# Patient Record
Sex: Female | Born: 1950 | Race: White | Hispanic: No | Marital: Married
Health system: Midwestern US, Community
[De-identification: ages and names within clinical notes are randomized; demographics above are authoritative.]

## PROBLEM LIST (undated history)

## (undated) DIAGNOSIS — G4700 Insomnia, unspecified: Secondary | ICD-10-CM

## (undated) DIAGNOSIS — B000 Eczema herpeticum: Secondary | ICD-10-CM

## (undated) DIAGNOSIS — E785 Hyperlipidemia, unspecified: Secondary | ICD-10-CM

## (undated) DIAGNOSIS — F1010 Alcohol abuse, uncomplicated: Secondary | ICD-10-CM

## (undated) DIAGNOSIS — F411 Generalized anxiety disorder: Secondary | ICD-10-CM

## (undated) DIAGNOSIS — E039 Hypothyroidism, unspecified: Secondary | ICD-10-CM

## (undated) HISTORY — DX: Alcohol abuse, uncomplicated: F10.10

## (undated) HISTORY — PX: ABDOMINAL HYSTERECTOMY: SHX81

## (undated) HISTORY — DX: Hyperlipidemia, unspecified: E78.5

## (undated) HISTORY — DX: Eczema herpeticum: B00.0

## (undated) HISTORY — DX: Hypothyroidism, unspecified: E03.9

## (undated) HISTORY — DX: Insomnia, unspecified: G47.00

## (undated) HISTORY — DX: Generalized anxiety disorder: F41.1

---

## 1976-05-19 HISTORY — PX: APPENDECTOMY: SHX54

## 2007-12-13 ENCOUNTER — Emergency Department: Payer: Self-pay | Admitting: Emergency Medicine

## 2007-12-23 ENCOUNTER — Ambulatory Visit: Payer: Self-pay | Admitting: Unknown Physician Specialty

## 2007-12-29 ENCOUNTER — Ambulatory Visit: Payer: Self-pay | Admitting: Internal Medicine

## 2008-01-18 ENCOUNTER — Ambulatory Visit: Payer: Self-pay | Admitting: Unknown Physician Specialty

## 2009-02-08 ENCOUNTER — Ambulatory Visit: Payer: Self-pay | Admitting: Internal Medicine

## 2010-03-12 ENCOUNTER — Ambulatory Visit: Payer: Self-pay | Admitting: Internal Medicine

## 2010-12-09 ENCOUNTER — Emergency Department: Payer: Self-pay | Admitting: *Deleted

## 2011-03-06 ENCOUNTER — Other Ambulatory Visit: Payer: Self-pay | Admitting: Internal Medicine

## 2011-03-07 ENCOUNTER — Other Ambulatory Visit: Payer: Self-pay | Admitting: Internal Medicine

## 2011-03-11 ENCOUNTER — Other Ambulatory Visit: Payer: Self-pay | Admitting: *Deleted

## 2011-03-11 MED ORDER — CHLORDIAZEPOXIDE HCL 25 MG PO CAPS: 25.0000 mg | ORAL_CAPSULE | Freq: Every day | ORAL | Status: AC

## 2011-03-11 NOTE — Telephone Encounter (Signed)
Opened in error

## 2011-03-12 ENCOUNTER — Other Ambulatory Visit: Payer: Self-pay | Admitting: Internal Medicine

## 2011-03-12 DIAGNOSIS — E039 Hypothyroidism, unspecified: Secondary | ICD-10-CM

## 2011-03-14 MED ORDER — LEVOTHYROXINE SODIUM 50 MCG PO TABS: 50.0000 ug | ORAL_TABLET | Freq: Every day | ORAL | Status: DC

## 2011-03-14 NOTE — Telephone Encounter (Signed)
Refill request for synthroid approved.

## 2011-03-19 ENCOUNTER — Ambulatory Visit: Payer: Self-pay | Admitting: Internal Medicine

## 2011-04-08 ENCOUNTER — Encounter: Payer: Self-pay | Admitting: Internal Medicine

## 2011-04-11 ENCOUNTER — Other Ambulatory Visit: Payer: Self-pay | Admitting: *Deleted

## 2011-04-11 MED ORDER — TRAZODONE HCL 100 MG PO TABS: 100.0000 mg | ORAL_TABLET | Freq: Every day | ORAL | Status: DC

## 2011-04-24 ENCOUNTER — Other Ambulatory Visit: Payer: Self-pay | Admitting: Internal Medicine

## 2011-04-24 NOTE — Telephone Encounter (Signed)
[phone] Pt called to get rx  For libruim  [facility] Pt had no idea what the dosage was.

## 2011-04-25 MED ORDER — CHLORDIAZEPOXIDE HCL 25 MG PO CAPS: 25.0000 mg | ORAL_CAPSULE | Freq: Every day | ORAL | Status: AC

## 2011-04-25 NOTE — Telephone Encounter (Signed)
Please find out when her last refill of chlordiazepoxide was.  Ok to refill if it has been 30 days

## 2011-06-11 ENCOUNTER — Other Ambulatory Visit: Payer: Self-pay | Admitting: *Deleted

## 2011-06-11 NOTE — Telephone Encounter (Signed)
Faxed request from [facility], no last filled date given.

## 2011-06-12 ENCOUNTER — Telehealth: Payer: Self-pay | Admitting: Internal Medicine

## 2011-06-12 NOTE — Telephone Encounter (Signed)
[phone] Pt called checking on her rx for librim  [facility] [address] [facility]  Yesterday Please advise pt if you have this

## 2011-06-12 NOTE — Telephone Encounter (Signed)
Refill request has been sent to Dr. Tullo.  

## 2011-06-12 NOTE — Telephone Encounter (Signed)
Pt is asking for refill, she is almost out.  She came by the office and signed a release of information form for [facility].

## 2011-06-13 NOTE — Telephone Encounter (Signed)
Pharmacy says last refill was yesterday, 06/12/11

## 2011-06-13 NOTE — Telephone Encounter (Signed)
Please ask pharmacy when last refill was

## 2011-06-17 ENCOUNTER — Encounter: Payer: Self-pay | Admitting: Internal Medicine

## 2011-07-09 ENCOUNTER — Other Ambulatory Visit: Payer: Self-pay | Admitting: *Deleted

## 2011-07-09 MED ORDER — CHLORDIAZEPOXIDE HCL 25 MG PO CAPS: 25.0000 mg | ORAL_CAPSULE | Freq: Every day | ORAL | Status: DC

## 2011-07-11 ENCOUNTER — Telehealth: Payer: Self-pay | Admitting: *Deleted

## 2011-07-11 NOTE — Telephone Encounter (Signed)
If this is a new request , it is denied,  She was given refill yesterday for #10 pills

## 2011-07-11 NOTE — Telephone Encounter (Signed)
Request for Chlordiazepoxide 25 mg

## 2011-07-14 ENCOUNTER — Other Ambulatory Visit: Payer: Self-pay | Admitting: Internal Medicine

## 2011-07-14 MED ORDER — TRAZODONE HCL 100 MG PO TABS: 100.0000 mg | ORAL_TABLET | Freq: Every day | ORAL | Status: DC

## 2011-07-16 ENCOUNTER — Ambulatory Visit: Payer: BC Managed Care – PPO | Admitting: *Deleted

## 2011-07-16 ENCOUNTER — Ambulatory Visit (INDEPENDENT_AMBULATORY_CARE_PROVIDER_SITE_OTHER)
Admission: RE | Admit: 2011-07-16 | Discharge: 2011-07-16 | Disposition: A | Discharge status: Home / Self Care | Payer: BC Managed Care – PPO | Source: Ambulatory Visit | Attending: Internal Medicine | Admitting: Internal Medicine

## 2011-07-16 ENCOUNTER — Other Ambulatory Visit: Payer: Self-pay | Admitting: Internal Medicine

## 2011-07-16 DIAGNOSIS — R071 Chest pain on breathing: Secondary | ICD-10-CM

## 2011-07-16 DIAGNOSIS — R0789 Other chest pain: Secondary | ICD-10-CM

## 2011-09-02 ENCOUNTER — Other Ambulatory Visit: Payer: Self-pay | Admitting: Internal Medicine

## 2011-09-02 MED ORDER — CHLORDIAZEPOXIDE HCL 25 MG PO CAPS: 25.0000 mg | ORAL_CAPSULE | Freq: Every day | ORAL | Status: DC

## 2011-09-02 NOTE — Telephone Encounter (Signed)
Patient called back again and wanted to know if you were going to refill the medication, I advised her that you are still seeing patient and when you had the chance to answer the message I would call her back.  She stated she is "about to crawl out of her skin".  I advised again that I would call her back.

## 2011-09-02 NOTE — Telephone Encounter (Signed)
Rx called in . Patient notified

## 2011-09-02 NOTE — Telephone Encounter (Signed)
Patient called and wanted to know if you could refill her Librium.  She stated she is under a great deal of stress right now, her mother has dementia, she is flying tonight to go to [place] to help her sister sell the estate, and her brother just had surgery on his brain.  Please advise.

## 2011-09-02 NOTE — Telephone Encounter (Signed)
Ok to refill librium #10 tablets.  Please tell patient that unless she makes an appt she will receive no more refills.  My office has been open 8 months now and I have not seen her.

## 2011-10-08 ENCOUNTER — Ambulatory Visit: Payer: BC Managed Care – PPO | Admitting: Internal Medicine

## 2011-10-16 ENCOUNTER — Other Ambulatory Visit: Payer: Self-pay | Admitting: Internal Medicine

## 2011-10-16 MED ORDER — CHLORDIAZEPOXIDE HCL 25 MG PO CAPS: 25.0000 mg | ORAL_CAPSULE | Freq: Every day | ORAL | Status: DC

## 2011-10-16 NOTE — Telephone Encounter (Signed)
Left detailed message notifying patient, Rx has been called in.

## 2011-10-16 NOTE — Telephone Encounter (Signed)
I will authorize a refill for #10 librium

## 2011-10-16 NOTE — Telephone Encounter (Signed)
Pt called stated her mom passed away yesterday she is leaving for [place] tonight  She will be leaving about 5 today to catch her flight  She stated that in the past dr Darrick Huntsman gave her libreium  Pt stated she didn't need that many just enough to get through this week She wanted to know if she could get a rx for this. [facility]

## 2011-10-26 ENCOUNTER — Other Ambulatory Visit: Payer: Self-pay | Admitting: Internal Medicine

## 2011-12-08 ENCOUNTER — Encounter: Payer: Self-pay | Admitting: *Deleted

## 2011-12-12 ENCOUNTER — Encounter: Payer: Self-pay | Admitting: Internal Medicine

## 2011-12-22 ENCOUNTER — Telehealth: Payer: Self-pay | Admitting: Internal Medicine

## 2011-12-22 NOTE — Telephone Encounter (Signed)
Pt walked in today with sever anxity did not want to leave message wants to talk to nurse this morning.  Offered pt 4 today.  Wanted to talk to nurse

## 2011-12-22 NOTE — Telephone Encounter (Signed)
PATIENT SCHEDULED FOR Friday.

## 2011-12-26 ENCOUNTER — Ambulatory Visit: Payer: BC Managed Care – PPO | Admitting: Internal Medicine

## 2012-01-20 ENCOUNTER — Other Ambulatory Visit: Payer: Self-pay | Admitting: *Deleted

## 2012-01-21 ENCOUNTER — Other Ambulatory Visit: Payer: Self-pay | Admitting: *Deleted

## 2012-01-23 MED ORDER — TRAZODONE HCL 100 MG PO TABS: 100.0000 mg | ORAL_TABLET | Freq: Every day | ORAL | Status: DC

## 2012-01-26 ENCOUNTER — Ambulatory Visit: Payer: BC Managed Care – PPO | Admitting: Internal Medicine

## 2012-01-26 ENCOUNTER — Telehealth: Payer: Self-pay | Admitting: Internal Medicine

## 2012-01-26 NOTE — Telephone Encounter (Signed)
Ms worrall has not been seen here and we have been open more over one year,  Please inform her that there will be no more refills on any of her medications untile she has been seen,

## 2012-01-27 NOTE — Telephone Encounter (Signed)
Patient was notified, she was transferred up front to schedule appt

## 2012-02-10 ENCOUNTER — Telehealth: Payer: Self-pay | Admitting: Internal Medicine

## 2012-02-10 NOTE — Telephone Encounter (Signed)
Yes we can waive the fee

## 2012-02-10 NOTE — Telephone Encounter (Signed)
Yes, we can.

## 2012-02-10 NOTE — Telephone Encounter (Signed)
Patient called in states that she received a No Show fee for 01/26/12. She states that it was a Monday appointment and she tried calling over the weekend to cancel however was unsuccessful.  Can we please waive her no show fee? Thanks [employer]

## 2012-02-11 ENCOUNTER — Encounter: Payer: Self-pay | Admitting: Internal Medicine

## 2012-02-11 ENCOUNTER — Ambulatory Visit (INDEPENDENT_AMBULATORY_CARE_PROVIDER_SITE_OTHER): Payer: BC Managed Care – PPO | Admitting: Internal Medicine

## 2012-02-11 VITALS — BP 162/88 | HR 72 | Temp 98.1°F | Resp 18 | Wt 154.5 lb

## 2012-02-11 DIAGNOSIS — R5383 Other fatigue: Secondary | ICD-10-CM

## 2012-02-11 DIAGNOSIS — F419 Anxiety disorder, unspecified: Secondary | ICD-10-CM

## 2012-02-11 DIAGNOSIS — F1010 Alcohol abuse, uncomplicated: Secondary | ICD-10-CM | POA: Insufficient documentation

## 2012-02-11 DIAGNOSIS — E039 Hypothyroidism, unspecified: Secondary | ICD-10-CM | POA: Insufficient documentation

## 2012-02-11 DIAGNOSIS — F411 Generalized anxiety disorder: Secondary | ICD-10-CM

## 2012-02-11 DIAGNOSIS — Z79899 Other long term (current) drug therapy: Secondary | ICD-10-CM

## 2012-02-11 DIAGNOSIS — E038 Other specified hypothyroidism: Secondary | ICD-10-CM

## 2012-02-11 DIAGNOSIS — R5381 Other malaise: Secondary | ICD-10-CM

## 2012-02-11 DIAGNOSIS — F1011 Alcohol abuse, in remission: Secondary | ICD-10-CM

## 2012-02-11 MED ORDER — PAROXETINE HCL ER 12.5 MG PO TB24: 12.5000 mg | ORAL_TABLET | ORAL | Status: DC

## 2012-02-11 MED ORDER — ALPRAZOLAM ER 1 MG PO TB24: 1.0000 mg | ORAL_TABLET | ORAL | Status: DC

## 2012-02-11 NOTE — Assessment & Plan Note (Signed)
I confronted her about her frequent use of Librium and as addressed possibility that she was using Librium and as a replacement for the alcohol. She understands the daily use of Librium amounts to dependence.

## 2012-02-11 NOTE — Progress Notes (Signed)
Patient ID: Anne Hickman, female   DOB: 12/08/1950, 61 y.o.   MRN: [id number] Patient Active Problem List  Diagnosis  . Alcohol abuse, in remission  . GAD (generalized anxiety disorder)  . Unspecified hypothyroidism    Subjective:  CC:   Chief Complaint  Patient presents with  . Follow-up  . Anxiety    HPI:   Ruthmary Occhipinti is a 61 y.o. female who presents as a new patient to establish primary care with the chief complaint of anxiety.  She is a 61 year old white female with a history of alcohol abuse and generalized anxiety who has not been seen in over year and is here to resstablixh care  No records are available from prior practice. She has been requesting refills of Librium which she has used in the past to help her detox from alcohol abuse. Over the past several months she has used the same excuse repeatedly to get her prescription refilled without being seen. In may she reported that her mother had died and that  she was flying to [place] that night and had run outo f librium. Today she states that this was a mistake, her mother is alive and living in an A/L home in [facility] for patients with dementia. She has been without Librium in several for several weeks apparently. She states that she is in maintaining her sobriety despite being out of Librium. She has persistent anxiety without true panic attacks. Her triggers are the continuing decline of her mother who has dementia and has been recently relocated to another facility. She gets very emotionally stressed when she is conversations with her sister who is the executor of her mother's estate. She has tried exercising to help relieve her tension. She has no prior trial of SSRIs or other medications to manage her anxiety. She uses trazodone to help her sleep at night.     Past Medical History  Diagnosis Date  . Insomnia   . Generalized anxiety disorder   . Other and unspecified hyperlipidemia   . Unspecified hypothyroidism   . Alcohol  abuse, episodic   . Eczema herpeticum     Past Surgical History  Procedure Date  . Appendectomy 1978    Family History  Problem Relation Age of Onset  . Coronary artery disease Father   . Heart disease Father   . Breast cancer Maternal Aunt   . Mental illness Mother     History   Social History  . Marital Status: Married    Spouse Name: N/A    Number of Children: 4  . Years of Education: N/A   Occupational History  . Full time    Social History Main Topics  . Smoking status: Never Smoker   . Smokeless tobacco: Never Used  . Alcohol Use: No     1- 2 bottles of wine a day  . Drug Use: No  . Sexually Active: Not on file   Other Topics Concern  . Not on file   Social History Narrative   Lives with spouse.         @ALLHX @    Review of Systems:   The remainder of the review of systems was negative except those addressed in the HPI.       Objective:  BP 162/88  Pulse 72  Temp 98.1 F (36.7 C) (Oral)  Resp 18  Wt 154 lb 8 oz (70.081 kg)  SpO2 97%  General appearance: alert, cooperative and appears stated age Ears: normal TM's and  external ear canals both ears Throat: lips, mucosa, and tongue normal; teeth and gums normal Neck: no adenopathy, no carotid bruit, supple, symmetrical, trachea midline and thyroid not enlarged, symmetric, no tenderness/mass/nodules Back: symmetric, no curvature. ROM normal. No CVA tenderness. Lungs: clear to auscultation bilaterally Heart: regular rate and rhythm, S1, S2 normal, no murmur, click, rub or gallop Abdomen: soft, non-tender; bowel sounds normal; no masses,  no organomegaly Pulses: 2+ and symmetric Skin: Skin color, texture, turgor normal. No rashes or lesions Lymph nodes: Cervical, supraclavicular, and axillary nodes normal.  Assessment and Plan:  Alcohol abuse, in remission I confronted her about her frequent use of Librium and as addressed possibility that she was using Librium and as a replacement  for the alcohol. She understands the daily use of Librium amounts to dependence.  GAD (generalized anxiety disorder) Trial of Paxil CR recommended and accepted. Initially we will also use alprazolam ER to help manage the anxiety initially. She will return in one month. She is also urged to resume her therapeutic relationship with Dr. Sebastian Ache     Updated Medication List Outpatient Encounter Prescriptions as of 02/11/2012  Medication Sig Dispense Refill  . levothyroxine (SYNTHROID, LEVOTHROID) 50 MCG tablet TAKE 1 TABLET BY MOUTH DAILY.  30 tablet  3  . traZODone (DESYREL) 100 MG tablet Take 1 tablet (100 mg total) by mouth at bedtime.  30 tablet  3  . ALPRAZolam (XANAX XR) 1 MG 24 hr tablet Take 1 tablet (1 mg total) by mouth every morning.  30 tablet  0  . PARoxetine (PAXIL-CR) 12.5 MG 24 hr tablet Take 1 tablet (12.5 mg total) by mouth every morning.  30 tablet  1  . DISCONTD: chlordiazePOXIDE (LIBRIUM) 25 MG capsule Take 1 capsule (25 mg total) by mouth daily. Take one capsule daily as needed for extreme anxiety  10 capsule  0  . DISCONTD: desoximetasone (TOPICORT) 0.25 % cream Apply cream as needed for eczema flare      . DISCONTD: fenofibrate (TRICOR) 145 MG tablet Take 145 mg by mouth daily.      Marland Kitchen DISCONTD: sertraline (ZOLOFT) 50 MG tablet Take one and a half tablets by mouth daily      . DISCONTD: traZODone (DESYREL) 100 MG tablet Take 1 tablet (100 mg total) by mouth at bedtime.  30 tablet  2     Orders Placed This Encounter  Procedures  . Lipid panel  . TSH  . Comprehensive metabolic panel    No Follow-up on file.

## 2012-02-11 NOTE — Patient Instructions (Addendum)
I am prescribing Paxil  At a low daily dose for management of generalized anxiety. . It may take up to 2 weeks to see the full effect   You may also take the alprazolam once daily  To manage the anxiety right away  Come back in 3 to 4 weeks  See Dr. Sebastian Ache as soon as possible to start with your counseling.  Continue Trazodone as needed for insomnia  I do recommend that you continue exercising daily to help manage your anxiety    Return to the office  At your earliest convenience to check your liver,kideny, thyroid and cholesterol.

## 2012-02-11 NOTE — Assessment & Plan Note (Signed)
Trial of Paxil CR recommended and accepted. Initially we will also use alprazolam ER to help manage the anxiety initially. She will return in one month. She is also urged to resume her therapeutic relationship with Dr. Sebastian Ache

## 2012-04-09 ENCOUNTER — Telehealth: Payer: Self-pay | Admitting: Internal Medicine

## 2012-04-09 NOTE — Telephone Encounter (Signed)
She was supposed to return for one month follow up and did not. The alprazolam is addictive just like the librium and I will refill one month only ,  If she does not keep an appt there will be no more refills.  She should continue  the paxil unless it is making her feel bad.

## 2012-04-09 NOTE — Telephone Encounter (Signed)
Caller: Anne Hickman; PCP: Duncan Dull (Adults only); CB#: [phone]; Call regarding Refill for xanax; started xanax and paxil in September 2013; wants Dr. Darrick Huntsman to know she would like to use just the xanax but not the paxil.  Denies emergent symptoms; declines new triage.  [phone]  May reach patient [phone].

## 2012-04-12 ENCOUNTER — Other Ambulatory Visit: Payer: Self-pay

## 2012-04-12 MED ORDER — ALPRAZOLAM ER 1 MG PO TB24: 1.0000 mg | ORAL_TABLET | ORAL | Status: DC

## 2012-04-12 NOTE — Telephone Encounter (Signed)
Alprazolam 1 mg 24 hr # 30 0 [facility] Patient advised that appt is needed for further refills.

## 2012-04-12 NOTE — Telephone Encounter (Signed)
Patient notified via phone, she stated that she had rescheduled her appt due to her not having her blood drawn yet.

## 2012-04-14 ENCOUNTER — Other Ambulatory Visit: Payer: BC Managed Care – PPO

## 2012-05-14 ENCOUNTER — Other Ambulatory Visit: Payer: Self-pay | Admitting: *Deleted

## 2012-05-14 ENCOUNTER — Telehealth: Payer: Self-pay | Admitting: *Deleted

## 2012-05-14 DIAGNOSIS — E785 Hyperlipidemia, unspecified: Secondary | ICD-10-CM

## 2012-05-14 DIAGNOSIS — R5381 Other malaise: Secondary | ICD-10-CM

## 2012-05-14 DIAGNOSIS — E569 Vitamin deficiency, unspecified: Secondary | ICD-10-CM

## 2012-05-14 NOTE — Telephone Encounter (Signed)
Fasting lipids, TSH,  CBC w diff, Vit D 25 OHD   and CMET  272.4, 780.79,  269.2 thanks

## 2012-05-14 NOTE — Telephone Encounter (Signed)
Pt is coming in for labs 12.30.2013 what labs and dx would you like? Thank you

## 2012-05-17 ENCOUNTER — Other Ambulatory Visit: Payer: BC Managed Care – PPO

## 2012-05-27 ENCOUNTER — Ambulatory Visit: Payer: Self-pay | Admitting: Internal Medicine

## 2012-06-04 ENCOUNTER — Ambulatory Visit (INDEPENDENT_AMBULATORY_CARE_PROVIDER_SITE_OTHER): Payer: BC Managed Care – PPO | Admitting: Internal Medicine

## 2012-06-04 ENCOUNTER — Other Ambulatory Visit: Payer: BC Managed Care – PPO

## 2012-06-04 ENCOUNTER — Encounter: Payer: Self-pay | Admitting: Internal Medicine

## 2012-06-04 VITALS — BP 150/78 | HR 74 | Temp 97.2°F | Resp 16 | Wt 156.2 lb

## 2012-06-04 DIAGNOSIS — F1011 Alcohol abuse, in remission: Secondary | ICD-10-CM

## 2012-06-04 DIAGNOSIS — F411 Generalized anxiety disorder: Secondary | ICD-10-CM

## 2012-06-04 DIAGNOSIS — K521 Toxic gastroenteritis and colitis: Secondary | ICD-10-CM

## 2012-06-04 DIAGNOSIS — R197 Diarrhea, unspecified: Secondary | ICD-10-CM

## 2012-06-04 DIAGNOSIS — J0100 Acute maxillary sinusitis, unspecified: Secondary | ICD-10-CM

## 2012-06-04 DIAGNOSIS — T3695XA Adverse effect of unspecified systemic antibiotic, initial encounter: Secondary | ICD-10-CM

## 2012-06-04 MED ORDER — METRONIDAZOLE 500 MG PO TABS: 500.0000 mg | ORAL_TABLET | Freq: Three times a day (TID) | ORAL | Status: DC

## 2012-06-04 MED ORDER — ALPRAZOLAM ER 1 MG PO TB24: 1.0000 mg | ORAL_TABLET | ORAL | Status: DC

## 2012-06-04 MED ORDER — BENZONATATE 200 MG PO CAPS: 200.0000 mg | ORAL_CAPSULE | Freq: Three times a day (TID) | ORAL | Status: DC | PRN

## 2012-06-04 NOTE — Patient Instructions (Addendum)
I am concerned that your diarrhea is contagious and needs to be treated with a different antibiotic called flagyl  Take it 3 times daily with food if possible and do not drink andy alcohol .   Stop the augmentin.  Do not use immodium  Clear liquid diet to let your bowels rest  Used sudafed PE 10 to 30 mg every 6 to 8 hours to decongest your sinuses  Flush sinuses twice daily with Simply saline to remove bacteria and hydrate passages.   Benzonatate capsules every 8 hours for cough.    Try to return the stool sample to confirm the presence

## 2012-06-04 NOTE — Progress Notes (Signed)
Patient ID: Anne Hickman, female   DOB: 03-02-1951, 62 y.o.   MRN: [id number]   Patient Active Problem List  Diagnosis  . Alcohol abuse, in remission  . GAD (generalized anxiety disorder)  . Unspecified hypothyroidism  . Antibiotic-associated diarrhea  . Sinusitis, acute maxillary    Subjective:  CC:   Chief Complaint  Patient presents with  . Congestion    HPI:   Anne Hickman a 62 y.o. female who presents with multiple issues. 1) URI Symptoms started before christmas  Took a z pack.  Still had a dry cough , frontal sinus pain no ear pain.  Now on augmentin since saturdday  2) DIARRHEA>  Started 3 days ago with   Anorexia, watery stools and fecal incontinence 3) Anxiety disorder:  Not tolerating paxil so stopped it.  Felt too drugged.  Using the alprazolam prn,  Not daily     Past Medical History  Diagnosis Date  . Insomnia   . Generalized anxiety disorder   . Other and unspecified hyperlipidemia   . Unspecified hypothyroidism   . Alcohol abuse, episodic   . Eczema herpeticum     Past Surgical History  Procedure Date  . Appendectomy 1978         The following portions of the patient's history were reviewed and updated as appropriate: Allergies, current medications, and problem list.    Review of Systems:   12 Pt  review of systems was negative except those addressed in the HPI,     History   Social History  . Marital Status: Married    Spouse Name: N/A    Number of Children: 4  . Years of Education: N/A   Occupational History  . Full time    Social History Main Topics  . Smoking status: Never Smoker   . Smokeless tobacco: Never Used  . Alcohol Use: No     Comment: 1- 2 bottles of wine a day  . Drug Use: No  . Sexually Active: Not on file   Other Topics Concern  . Not on file   Social History Narrative   Lives with spouse.    Objective:  BP 150/78  Pulse 74  Temp 97.2 F (36.2 C) (Oral)  Resp 16  Wt 156 lb 4 oz (70.875 kg)   SpO2 95%  General appearance: alert, cooperative and appears stated age Ears: normal TM's and external ear canals both ears Throat: lips, mucosa, and tongue normal; teeth and gums normal Neck: no adenopathy, no carotid bruit, supple, symmetrical, trachea midline and thyroid not enlarged, symmetric, no tenderness/mass/nodules Back: symmetric, no curvature. ROM normal. No CVA tenderness. Lungs: clear to auscultation bilaterally Heart: regular rate and rhythm, S1, S2 normal, no murmur, click, rub or gallop Abdomen: soft, non-tender; bowel sounds normal; no masses,  no organomegaly Pulses: 2+ and symmetric Skin: Skin color, texture, turgor normal. No rashes or lesions Lymph nodes: Cervical, supraclavicular, and axillary nodes normal.  Assessment and Plan:  Alcohol abuse, in remission Continueweekly AA meetings ,caution with alprazolam use.  Antibiotic-associated diarrhea presumed secondary to C difficile colitis given description and history of antbiotic use. Contact precautions and empiric oral flagyl.  Sinusitis, acute maxillary Now resolved.  Does not need more antibiotics,  Just good sinus hygeine wchich was outlinedtoday   Updated Medication List Outpatient Encounter Prescriptions as of 06/04/2012  Medication Sig Dispense Refill  . levothyroxine (SYNTHROID, LEVOTHROID) 50 MCG tablet TAKE 1 TABLET BY MOUTH DAILY.  30 tablet  3  . [DISCONTINUED]  amoxicillin-clavulanate (AUGMENTIN) 875-125 MG per tablet Take 1 tablet by mouth 2 (two) times daily.      Marland Kitchen ALPRAZolam (XANAX XR) 1 MG 24 hr tablet Take 1 tablet (1 mg total) by mouth every morning.  30 tablet  5  . benzonatate (TESSALON) 200 MG capsule Take 1 capsule (200 mg total) by mouth 3 (three) times daily as needed for cough.  60 capsule  0  . metroNIDAZOLE (FLAGYL) 500 MG tablet Take 1 tablet (500 mg total) by mouth 3 (three) times daily.  30 tablet  0  . traZODone (DESYREL) 100 MG tablet Take 1 tablet (100 mg total) by mouth at  bedtime.  30 tablet  3  . [DISCONTINUED] ALPRAZolam (XANAX XR) 1 MG 24 hr tablet Take 1 tablet (1 mg total) by mouth every morning.  30 tablet  0  . [DISCONTINUED] HYDROcodone-homatropine (HYCODAN) 5-1.5 MG/5ML syrup       . [DISCONTINUED] PARoxetine (PAXIL-CR) 12.5 MG 24 hr tablet Take 1 tablet (12.5 mg total) by mouth every morning.  30 tablet  1     Orders Placed This Encounter  Procedures  . Clostridium difficile toxin  . Stool culture    No Follow-up on file.

## 2012-06-06 ENCOUNTER — Encounter: Payer: Self-pay | Admitting: Internal Medicine

## 2012-06-06 DIAGNOSIS — T3695XA Adverse effect of unspecified systemic antibiotic, initial encounter: Secondary | ICD-10-CM | POA: Insufficient documentation

## 2012-06-06 DIAGNOSIS — K521 Toxic gastroenteritis and colitis: Secondary | ICD-10-CM | POA: Insufficient documentation

## 2012-06-06 DIAGNOSIS — J0100 Acute maxillary sinusitis, unspecified: Secondary | ICD-10-CM | POA: Insufficient documentation

## 2012-06-06 NOTE — Assessment & Plan Note (Signed)
Continueweekly AA meetings ,caution with alprazolam use.

## 2012-06-06 NOTE — Assessment & Plan Note (Signed)
Now resolved.  Does not need more antibiotics,  Just good sinus hygeine wchich was outlinedtoday

## 2012-06-06 NOTE — Assessment & Plan Note (Signed)
presumed secondary to C difficile colitis given description and history of antbiotic use. Contact precautions and empiric oral flagyl.

## 2012-06-07 ENCOUNTER — Other Ambulatory Visit: Payer: BC Managed Care – PPO

## 2012-06-09 ENCOUNTER — Encounter: Payer: Self-pay | Admitting: Internal Medicine

## 2012-06-23 ENCOUNTER — Other Ambulatory Visit: Payer: Self-pay | Admitting: Internal Medicine

## 2012-06-28 ENCOUNTER — Ambulatory Visit: Payer: Self-pay | Admitting: Specialist

## 2012-07-02 ENCOUNTER — Ambulatory Visit: Payer: Self-pay | Admitting: Specialist

## 2012-07-05 LAB — PATHOLOGY REPORT

## 2012-07-23 ENCOUNTER — Other Ambulatory Visit: Payer: Self-pay | Admitting: Internal Medicine

## 2012-09-20 ENCOUNTER — Other Ambulatory Visit: Payer: Self-pay | Admitting: Internal Medicine

## 2012-09-20 NOTE — Telephone Encounter (Signed)
Ok to refill 

## 2013-01-07 ENCOUNTER — Telehealth: Payer: Self-pay | Admitting: Internal Medicine

## 2013-01-07 MED ORDER — ALPRAZOLAM ER 1 MG PO TB24: 1.0000 mg | ORAL_TABLET | ORAL | Status: DC

## 2013-01-07 NOTE — Telephone Encounter (Signed)
Script phoned in

## 2013-01-07 NOTE — Telephone Encounter (Signed)
The patient is in [place] and her prescription has expired. She is needing them refilled. [facility] [place] phone # [phone].  ALPRAZolam (XANAX XR) 1 MG 24 hr tablet

## 2013-01-07 NOTE — Telephone Encounter (Signed)
Last OV 1/14 Please advise of refill

## 2013-01-07 NOTE — Telephone Encounter (Signed)
Refill one 30 days only.  Ok to call to [place] pharmacy.  No more refills without OV

## 2013-03-22 ENCOUNTER — Telehealth: Payer: Self-pay | Admitting: *Deleted

## 2013-03-22 MED ORDER — LEVOCETIRIZINE DIHYDROCHLORIDE 5 MG PO TABS: 5.0000 mg | ORAL_TABLET | Freq: Every evening | ORAL | Status: DC

## 2013-03-22 NOTE — Telephone Encounter (Signed)
Patient would like to know if you will refill Xyzal for her last OV 1/14?

## 2013-03-22 NOTE — Telephone Encounter (Signed)
Yes, done.  

## 2013-03-22 NOTE — Telephone Encounter (Signed)
Left message, notifying pt Rx ready for pickup 

## 2013-03-24 ENCOUNTER — Other Ambulatory Visit: Payer: Self-pay | Admitting: Internal Medicine

## 2013-03-24 NOTE — Telephone Encounter (Signed)
Eprescribed.

## 2013-05-16 ENCOUNTER — Other Ambulatory Visit: Payer: Self-pay | Admitting: Internal Medicine

## 2013-05-30 ENCOUNTER — Other Ambulatory Visit: Payer: Self-pay | Admitting: Internal Medicine

## 2013-05-31 NOTE — Telephone Encounter (Signed)
30 day refill only,  Needs office visit  prior to any more refills

## 2013-06-02 NOTE — Telephone Encounter (Signed)
Left detailed message per DPR medication faxed to pharmacy patient must be seen for further refills

## 2013-07-18 ENCOUNTER — Encounter: Payer: Self-pay | Admitting: Internal Medicine

## 2013-07-18 ENCOUNTER — Ambulatory Visit (INDEPENDENT_AMBULATORY_CARE_PROVIDER_SITE_OTHER): Payer: BC Managed Care – PPO | Admitting: Internal Medicine

## 2013-07-18 VITALS — BP 130/78 | HR 78 | Temp 98.0°F | Resp 18 | Wt 168.2 lb

## 2013-07-18 DIAGNOSIS — E785 Hyperlipidemia, unspecified: Secondary | ICD-10-CM

## 2013-07-18 DIAGNOSIS — K7010 Alcoholic hepatitis without ascites: Secondary | ICD-10-CM

## 2013-07-18 DIAGNOSIS — E039 Hypothyroidism, unspecified: Secondary | ICD-10-CM

## 2013-07-18 DIAGNOSIS — F411 Generalized anxiety disorder: Secondary | ICD-10-CM

## 2013-07-18 DIAGNOSIS — F1010 Alcohol abuse, uncomplicated: Secondary | ICD-10-CM

## 2013-07-18 DIAGNOSIS — E559 Vitamin D deficiency, unspecified: Secondary | ICD-10-CM

## 2013-07-18 LAB — CBC WITH DIFFERENTIAL/PLATELET
BASOS ABS: 0.1 10*3/uL (ref 0.0–0.1)
Basophils Relative: 0.7 % (ref 0.0–3.0)
EOS ABS: 0.1 10*3/uL (ref 0.0–0.7)
Eosinophils Relative: 0.7 % (ref 0.0–5.0)
HEMATOCRIT: 41.6 % (ref 36.0–46.0)
HEMOGLOBIN: 13.8 g/dL (ref 12.0–15.0)
Lymphocytes Relative: 31 % (ref 12.0–46.0)
Lymphs Abs: 2.4 10*3/uL (ref 0.7–4.0)
MCHC: 33.1 g/dL (ref 30.0–36.0)
MCV: 96.3 fl (ref 78.0–100.0)
MONO ABS: 0.8 10*3/uL (ref 0.1–1.0)
Monocytes Relative: 10 % (ref 3.0–12.0)
NEUTROS ABS: 4.5 10*3/uL (ref 1.4–7.7)
Neutrophils Relative %: 57.6 % (ref 43.0–77.0)
PLATELETS: 234 10*3/uL (ref 150.0–400.0)
RBC: 4.32 Mil/uL (ref 3.87–5.11)
RDW: 13.8 % (ref 11.5–14.6)
WBC: 7.8 10*3/uL (ref 4.5–10.5)

## 2013-07-18 LAB — COMPREHENSIVE METABOLIC PANEL
ALK PHOS: 119 U/L — AB (ref 39–117)
ALT: 58 U/L — ABNORMAL HIGH (ref 0–35)
AST: 151 U/L — AB (ref 0–37)
Albumin: 4 g/dL (ref 3.5–5.2)
BUN: 8 mg/dL (ref 6–23)
CALCIUM: 8.9 mg/dL (ref 8.4–10.5)
CHLORIDE: 95 meq/L — AB (ref 96–112)
CO2: 26 mEq/L (ref 19–32)
CREATININE: 0.6 mg/dL (ref 0.4–1.2)
GFR: 119.49 mL/min (ref >60.00)
Glucose, Bld: 76 mg/dL (ref 70–99)
Potassium: 4 mEq/L (ref 3.5–5.1)
Sodium: 134 mEq/L — ABNORMAL LOW (ref 135–145)
Total Bilirubin: 1.4 mg/dL — ABNORMAL HIGH (ref 0.3–1.2)
Total Protein: 8 g/dL (ref 6.0–8.3)

## 2013-07-18 LAB — TSH: TSH: 4.48 u[IU]/mL (ref 0.35–5.50)

## 2013-07-18 LAB — LDL CHOLESTEROL, DIRECT: LDL DIRECT: 36.5 mg/dL

## 2013-07-18 LAB — MAGNESIUM: Magnesium: 1.6 mg/dL (ref 1.5–2.5)

## 2013-07-18 MED ORDER — CHLORDIAZEPOXIDE HCL 25 MG PO CAPS: ORAL_CAPSULE | ORAL | Status: DC

## 2013-07-18 NOTE — Patient Instructions (Addendum)
DO NOT START THE LIBRIUM UNTIL YOU HAVE REMOVED ALL ALCOHOL FROM YOUR HOUSE  If your liver function is off,  I will instruct you to start the Librium taper 3 times daily instead of 4  We will call you with the labs

## 2013-07-18 NOTE — Progress Notes (Signed)
Pre-visit discussion using our clinic review tool. No additional management support is needed unless otherwise documented below in the visit note.  

## 2013-07-18 NOTE — Progress Notes (Signed)
Patient ID: Anne Hickman, female   DOB: 17-Jun-1950, 63 y.o.   MRN: [id number]   Patient Active Problem List   Diagnosis Date Noted  . Hepatitis, alcoholic, acute 50/93/2671  . Hypomagnesemia 07/19/2013  . Antibiotic-associated diarrhea 06/06/2012  . Alcohol abuse 02/11/2012  . GAD (generalized anxiety disorder) 02/11/2012  . Unspecified hypothyroidism 02/11/2012    Subjective:  CC:   Chief Complaint  Patient presents with  . Acute Visit    weeks /   . Sinusitis    HPI:   Anne Hickman is a 63 y.o. female who presents for Alcohol abuse and generalized anxiety.   Patient has been lost to follow up for over one year and was given an acute slot for "sinusitis."  However patient is  really here because she has lost her sobriety and is requesting pharmacotherapy for detoxification  Her last drink was today.  Her husband is supportive of her efforts but her sister who lives in [place] is largely unsupportive and emotionally manipulative due to her inability to cope with their mother's declining health at the age of 16 secondary to Alzheimers Dementia and DJD knees.  Patient's sister and mother live in [place], mother is in an A/L facility and is wheelchair bound due to DJD.  Sister is primary caregiver , has multiple personal medical issues but refuses to delegate.  Patient has spent most of her savings flying down to [facility] for "emergencies" regarding mother's health only to find no reversible issues, and acknowledges that she needs to draw boundaries with sister who is constantly  guilting her into  Making the trips.   Her last drink was this morning.  She reports that she has not taken any alprazolam while drinking.  She has not resumed AA meetings because she did not like the atmosphere of the meetings in [place], near her home.   Wants to resume seeing Dr Marjory Lies instread of AA bc the prior AA climate/atmosphere was "unprofessional"     Past Medical History  Diagnosis Date  . Insomnia   .  Generalized anxiety disorder   . Other and unspecified hyperlipidemia   . Unspecified hypothyroidism   . Alcohol abuse, episodic   . Eczema herpeticum     Past Surgical History  Procedure Laterality Date  . Appendectomy  1978       The following portions of the patient's history were reviewed and updated as appropriate: Allergies, current medications, and problem list.    Review of Systems:   Patient denies headache, fevers, malaise, unintentional weight loss, skin rash, eye pain, sinus congestion and sinus pain, sore throat, dysphagia,  hemoptysis , cough, dyspnea, wheezing, chest pain, palpitations, orthopnea, edema, abdominal pain, nausea, melena, diarrhea, constipation, flank pain, dysuria, hematuria, urinary  Frequency, nocturia, numbness, tingling, seizures,  Focal weakness, Loss of consciousness,   and suicidal ideation.     History   Social History  . Marital Status: Married    Spouse Name: N/A    Number of Children: 4  . Years of Education: N/A   Occupational History  . Full time    Social History Main Topics  . Smoking status: Never Smoker   . Smokeless tobacco: Never Used  . Alcohol Use: No     Comment: 1- 2 bottles of wine a day  . Drug Use: No  . Sexual Activity: Not on file   Other Topics Concern  . Not on file   Social History Narrative   Lives with spouse.  Objective:  Filed Vitals:   07/18/13 1356  BP: 130/78  Pulse: 78  Temp: 98 F (36.7 C)  Resp: 18     General appearance: alert, cooperative and appears stated age Ears: normal TM's and external ear canals both ears Throat: lips, mucosa, and tongue normal; teeth and gums normal Neck: no adenopathy, no carotid bruit, supple, symmetrical, trachea midline and thyroid not enlarged, symmetric, no tenderness/mass/nodules Back: symmetric, no curvature. ROM normal. No CVA tenderness. Lungs: clear to auscultation bilaterally Heart: regular rate and rhythm, S1, S2 normal, no murmur, click,  rub or gallop Abdomen: soft, non-tender; bowel sounds normal; no masses,  no organomegaly Pulses: 2+ and symmetric Skin: Skin color, texture, turgor normal. No rashes or lesions Lymph nodes: Cervical, supraclavicular, and axillary nodes normal.  Assessment and Plan:  Alcohol abuse Checking liver enzymes today, which are elevated as expected.  Encourage patient to attend AA in [place] as an alternative to the [facility].  Referral to Dr Marjory Lies.  Librium taper prescribed.  Lab Results  Component Value Date   ALT 58* 07/18/2013   AST 151* 07/18/2013   ALKPHOS 119* 07/18/2013   BILITOT 1.4* 07/18/2013     Hepatitis, alcoholic, acute Mild, without jaundice or other symptoms.  Proceed with detox using librium taper.   Unspecified hypothyroidism Lab Results  Component Value Date   TSH 4.48 07/18/2013   . Thyroid function is WNL on current dose.  No current changes needed.    GAD (generalized anxiety disorder) Trial of Paxil CR was helpful in the past along with alprazolam ER but given current librium taper will suspend alprazolam until detox is complete.  Return in one week. Referral to Dr Marjory Lies.    Hypomagnesemia Mg oxide x 1 week   A total of 40 minutes was spent with patient more than half of which was spent in counseling, reviewing recordsand coordination of care.  Updated Medication List Outpatient Encounter Prescriptions as of 07/18/2013  Medication Sig  . ALPRAZOLAM XR 1 MG 24 hr tablet TAKE 1 TABLET BY MOUTH IN THE MORNING  . levocetirizine (XYZAL) 5 MG tablet Take 1 tablet (5 mg total) by mouth every evening.  Marland Kitchen levothyroxine (SYNTHROID, LEVOTHROID) 50 MCG tablet TAKE 1 TABLET BY MOUTH DAILY.  . traZODone (DESYREL) 100 MG tablet TAKE 1 TABLET (100 MG TOTAL) BY MOUTH AT BEDTIME.  . chlordiazePOXIDE (LIBRIUM) 25 MG capsule 4 times daily  For one day, then three times daily for one day,  Then twice daily for one day, then once daily for two days, then  stop  . Magnesium Oxide  400 MG CAPS Take 1 capsule (400 mg total) by mouth 2 (two) times daily.  . [DISCONTINUED] benzonatate (TESSALON) 200 MG capsule TAKE 1 CAPSULE (200 MG TOTAL) BY MOUTH 3 (THREE) TIMES DAILY AS NEEDED FOR COUGH.  . [DISCONTINUED] metroNIDAZOLE (FLAGYL) 500 MG tablet Take 1 tablet (500 mg total) by mouth 3 (three) times daily.     Orders Placed This Encounter  Procedures  . Magnesium  . Comprehensive metabolic panel  . TSH  . Vit D  25 hydroxy (rtn osteoporosis monitoring)  . CBC with Differential  . LDL cholesterol, direct  . Ambulatory referral to Psychology    No Follow-up on file.

## 2013-07-19 ENCOUNTER — Telehealth: Payer: Self-pay | Admitting: Internal Medicine

## 2013-07-19 DIAGNOSIS — K7010 Alcoholic hepatitis without ascites: Secondary | ICD-10-CM | POA: Insufficient documentation

## 2013-07-19 LAB — VITAMIN D 25 HYDROXY (VIT D DEFICIENCY, FRACTURES): VIT D 25 HYDROXY: 52 ng/mL (ref 30–89)

## 2013-07-19 MED ORDER — MAGNESIUM OXIDE 400 MG PO CAPS: 1.0000 | ORAL_CAPSULE | Freq: Two times a day (BID) | ORAL | Status: DC

## 2013-07-19 NOTE — Assessment & Plan Note (Signed)
Mild, without jaundice or other symptoms.  Proceed with detox using librium taper.

## 2013-07-19 NOTE — Telephone Encounter (Signed)
Pt has questions about labs your spoke to her about this morning.  She wanted to know if any kidneys test were done. Pt is concerned about her heptitias    Pt would like a call back this afternoon is possible

## 2013-07-19 NOTE — Assessment & Plan Note (Signed)
Lab Results  Component Value Date   TSH 4.48 07/18/2013   . Thyroid function is WNL on current dose.  No current changes needed.     

## 2013-07-19 NOTE — Telephone Encounter (Signed)
Left message for patient to return call.

## 2013-07-19 NOTE — Assessment & Plan Note (Signed)
Mg oxide x 1 week

## 2013-07-19 NOTE — Assessment & Plan Note (Signed)
Trial of Paxil CR was helpful in the past along with alprazolam ER but given current librium taper will suspend alprazolam until detox is complete.  Return in one week. Referral to Dr Marjory Lies.

## 2013-07-19 NOTE — Assessment & Plan Note (Signed)
Checking liver enzymes today, which are elevated as expected.  Encourage patient to attend AA in [place] as an alternative to the [facility].  Referral to Dr Marjory Lies.  Librium taper prescribed.  Lab Results  Component Value Date   ALT 58* 07/18/2013   AST 151* 07/18/2013   ALKPHOS 119* 07/18/2013   BILITOT 1.4* 07/18/2013

## 2013-07-22 ENCOUNTER — Ambulatory Visit: Payer: BC Managed Care – PPO | Admitting: Internal Medicine

## 2013-07-26 ENCOUNTER — Ambulatory Visit: Payer: BC Managed Care – PPO | Admitting: Internal Medicine

## 2013-07-31 ENCOUNTER — Other Ambulatory Visit: Payer: Self-pay | Admitting: Internal Medicine

## 2013-08-04 ENCOUNTER — Encounter: Payer: Self-pay | Admitting: Internal Medicine

## 2013-08-04 ENCOUNTER — Other Ambulatory Visit: Payer: Self-pay | Admitting: *Deleted

## 2013-08-04 ENCOUNTER — Ambulatory Visit (INDEPENDENT_AMBULATORY_CARE_PROVIDER_SITE_OTHER): Payer: BC Managed Care – PPO | Admitting: Internal Medicine

## 2013-08-04 VITALS — BP 130/84 | HR 62 | Temp 97.3°F | Resp 18 | Wt 168.0 lb

## 2013-08-04 DIAGNOSIS — J209 Acute bronchitis, unspecified: Secondary | ICD-10-CM

## 2013-08-04 DIAGNOSIS — F1010 Alcohol abuse, uncomplicated: Secondary | ICD-10-CM

## 2013-08-04 MED ORDER — BENZONATATE 200 MG PO CAPS: 200.0000 mg | ORAL_CAPSULE | Freq: Three times a day (TID) | ORAL | Status: DC | PRN

## 2013-08-04 MED ORDER — PREDNISONE (PAK) 10 MG PO TABS: ORAL_TABLET | ORAL | Status: DC

## 2013-08-04 MED ORDER — FLUCONAZOLE 150 MG PO TABS: 150.0000 mg | ORAL_TABLET | Freq: Every day | ORAL | Status: DC

## 2013-08-04 MED ORDER — CHLORDIAZEPOXIDE HCL 25 MG PO CAPS: ORAL_CAPSULE | ORAL | Status: DC

## 2013-08-04 MED ORDER — FLUNISOLIDE HFA 80 MCG/ACT IN AERS: 1.0000 | INHALATION_SPRAY | Freq: Two times a day (BID) | RESPIRATORY_TRACT | Status: DC

## 2013-08-04 MED ORDER — LEVOTHYROXINE SODIUM 50 MCG PO TABS: ORAL_TABLET | ORAL | Status: DC

## 2013-08-04 MED ORDER — AMOXICILLIN-POT CLAVULANATE 875-125 MG PO TABS: 1.0000 | ORAL_TABLET | Freq: Two times a day (BID) | ORAL | Status: DC

## 2013-08-04 NOTE — Progress Notes (Signed)
Patient ID: Anne Hickman, female   DOB: Apr 20, 1951, 63 y.o.   MRN: [id number]  Patient Active Problem List   Diagnosis Date Noted  . Acute bronchitis 08/07/2013  . Hepatitis, alcoholic, acute 32/67/1245  . Hypomagnesemia 07/19/2013  . Alcohol abuse 02/11/2012  . GAD (generalized anxiety disorder) 02/11/2012  . Unspecified hypothyroidism 02/11/2012    Subjective:  CC:   Chief Complaint  Patient presents with  . Follow-up    HPI:   Anne Hickman is a 63 y.o. female who presents for Two week follow up on alcohol abuse and dependence requesting detoxification with librium.  She was treated for bronchitis and influenza last Friday by Urgent Care  With tussionex, tessalon perles,  Prednisone .  She did not tell the physician that she was an alcoholic and has been using the tussionex but does not like the way it makes her feel and is worried that she may need to restart the librium taper I gave her last week . She has been abstinent from alcohol.   She feels generally miserable, with no significant improvement from last Friday.  Her sinus drainage has changed from clear/yellow to green and has developed wheezing  With her cough. Denies fevers.    Past Medical History  Diagnosis Date  . Insomnia   . Generalized anxiety disorder   . Other and unspecified hyperlipidemia   . Unspecified hypothyroidism   . Alcohol abuse, episodic   . Eczema herpeticum     Past Surgical History  Procedure Laterality Date  . Appendectomy  1978       The following portions of the patient's history were reviewed and updated as appropriate: Allergies, current medications, and problem list.    Review of Systems:   Patient denies headache, fevers, malaise, unintentional weight loss, skin rash, eye pain, sinus congestion and sinus pain, sore throat, dysphagia,  hemoptysis , cough, dyspnea, wheezing, chest pain, palpitations, orthopnea, edema, abdominal pain, nausea, melena, diarrhea, constipation,  flank pain, dysuria, hematuria, urinary  Frequency, nocturia, numbness, tingling, seizures,  Focal weakness, Loss of consciousness,  Tremor, insomnia, depression, anxiety, and suicidal ideation.     History   Social History  . Marital Status: Married    Spouse Name: N/A    Number of Children: 4  . Years of Education: N/A   Occupational History  . Full time    Social History Main Topics  . Smoking status: Never Smoker   . Smokeless tobacco: Never Used  . Alcohol Use: No     Comment: 1- 2 bottles of wine a day  . Drug Use: No  . Sexual Activity: Not on file   Other Topics Concern  . Not on file   Social History Narrative   Lives with spouse.    Objective:  Filed Vitals:   08/04/13 1636  BP: 130/84  Pulse: 62  Temp: 97.3 F (36.3 C)  Resp: 18     General appearance: alert, cooperative and appears stated age Ears: serous effusion with erythematous right TM ,  Left TM normal Throat: lips, mucosa, and tongue normal; teeth and gums normal Neck: no adenopathy, no carotid bruit, supple, symmetrical, trachea midline and thyroid not enlarged, symmetric, no tenderness/mass/nodules Back: symmetric, no curvature. ROM normal. No CVA tenderness. Lungs: bilateral wheezing,  Fair air movement,  No egophonyy Heart: regular rate and rhythm, S1, S2 normal, no murmur, click, rub or gallop Abdomen: soft, non-tender; bowel sounds normal; no masses,  no organomegaly Pulses: 2+ and symmetric Skin: Skin color,  texture, turgor normal. No rashes or lesions Lymph nodes: Cervical, supraclavicular, and axillary nodes normal.  Assessment and Plan:  Alcohol abuse Given her recent use of tussionex for cough, we will repeat her detoxification with a second librium taper.   Acute bronchitis She prefers to avoid albuterol due to prior events of tachycardia and tremors.  Prednisone taper,  Steroid MDI given, and empiric abx given purulent sputum reported and HEENT exam suggesting  sinusitis/otitis.  Use of probiotic advised.    Updated Medication List Outpatient Encounter Prescriptions as of 08/04/2013  Medication Sig  . ALPRAZOLAM XR 1 MG 24 hr tablet TAKE 1 TABLET BY MOUTH IN THE MORNING  . benzonatate (TESSALON) 200 MG capsule Take 1 capsule by mouth 3 (three) times daily as needed.  . chlordiazePOXIDE (LIBRIUM) 25 MG capsule 4 times daily  For one day, then three times daily for one day,  Then twice daily for one day, then once daily for two days, then  stop  . HYDROcodone-homatropine (HYCODAN) 5-1.5 MG/5ML syrup Take 5 mLs by mouth 4 (four) times daily as needed.  Marland Kitchen levocetirizine (XYZAL) 5 MG tablet Take 1 tablet (5 mg total) by mouth every evening.  Marland Kitchen levothyroxine (SYNTHROID, LEVOTHROID) 50 MCG tablet TAKE 1 TABLET BY MOUTH DAILY.  Marland Kitchen levothyroxine (SYNTHROID, LEVOTHROID) 50 MCG tablet TAKE 1 TABLET BY MOUTH DAILY.  . Magnesium Oxide 400 MG CAPS Take 1 capsule (400 mg total) by mouth 2 (two) times daily.  . traZODone (DESYREL) 100 MG tablet TAKE 1 TABLET (100 MG TOTAL) BY MOUTH AT BEDTIME.  . [DISCONTINUED] chlordiazePOXIDE (LIBRIUM) 25 MG capsule 4 times daily  For one day, then three times daily for one day,  Then twice daily for one day, then once daily for two days, then  stop  . amoxicillin-clavulanate (AUGMENTIN) 875-125 MG per tablet Take 1 tablet by mouth 2 (two) times daily.  . benzonatate (TESSALON) 200 MG capsule Take 1 capsule (200 mg total) by mouth 3 (three) times daily as needed for cough.  . fluconazole (DIFLUCAN) 150 MG tablet Take 1 tablet (150 mg total) by mouth daily.  . Flunisolide HFA 80 MCG/ACT AERS Inhale 1 puff into the lungs 2 (two) times daily.  . predniSONE (STERAPRED UNI-PAK) 10 MG tablet 6 tablets on Day 1 , then reduce by 1 tablet daily until gone  . [DISCONTINUED] levothyroxine (SYNTHROID, LEVOTHROID) 50 MCG tablet TAKE 1 TABLET BY MOUTH DAILY.     No orders of the defined types were placed in this encounter.    No Follow-up on  file.

## 2013-08-04 NOTE — Progress Notes (Signed)
Pre-visit discussion using our clinic review tool. No additional management support is needed unless otherwise documented below in the visit note.  

## 2013-08-04 NOTE — Patient Instructions (Addendum)
You have a sinus/ear infection   .  I am prescribing an antibiotic (augmentin and another prednisone taper  To manage the infectin and the inflammation in your ear/sinuses.   I also advise use of the following OTC meds to help with your other symptoms.   Take generic OTC benadryl 25 mg every 8 hours for the drainage,  Afrin nasal spray every 12 hours for the congestion,  flush your sinuses twice daily with Ayr  Use benzonatate capsules  FOR THE COUGH. Aerospan 1 puff twice daily for chest tightness and wheezing  (steroid inhaler ,  SAMPLES GIVEN )  Gargle with salt water as needed for sore throat.   Please take a probiotic ( Align, Floraque or Culturelle) for 2 weeks if you start the antibiotic to prevent a serious antibiotic associated diarrhea  Called" clostridium dificile colitis" ( should also help prevent   vaginal yeast infection)

## 2013-08-07 DIAGNOSIS — J209 Acute bronchitis, unspecified: Secondary | ICD-10-CM | POA: Insufficient documentation

## 2013-08-07 NOTE — Assessment & Plan Note (Addendum)
She prefers to avoid albuterol due to prior events of tachycardia and tremors.  Prednisone taper,  Steroid MDI given, and empiric abx given purulent sputum reported and HEENT exam suggesting sinusitis/otitis.  Use of probiotic advised.

## 2013-08-07 NOTE — Assessment & Plan Note (Signed)
Given her recent use of tussionex for cough, we will repeat her detoxification with a second librium taper.

## 2013-08-10 ENCOUNTER — Telehealth: Payer: Self-pay | Admitting: Internal Medicine

## 2013-08-10 MED ORDER — MAGNESIUM OXIDE 400 MG PO CAPS: 1.0000 | ORAL_CAPSULE | Freq: Two times a day (BID) | ORAL | Status: DC

## 2013-08-10 NOTE — Telephone Encounter (Signed)
[facility] magnesium with no response.  Called to ensure we are aware the pt has switched pharmacies from [facility] to [facility].  States pt is calling them very often to check on the magnesium rx.

## 2013-08-10 NOTE — Telephone Encounter (Signed)
Patient notified

## 2013-10-05 ENCOUNTER — Telehealth: Payer: Self-pay | Admitting: Internal Medicine

## 2013-10-05 MED ORDER — CHLORDIAZEPOXIDE HCL 25 MG PO CAPS: ORAL_CAPSULE | ORAL | Status: DC

## 2013-10-05 NOTE — Telephone Encounter (Signed)
Patient Information:  Caller Name: Harpreet  Phone: [phone]  Patient: Anne Hickman  Gender: Female  DOB: 1950/11/10  Age: 63 Years  PCP: Deborra Medina (Adults only)  Office Follow Up:  Does the office need to follow up with this patient?: No  Instructions For The Office: N/A   Symptoms  Reason For Call & Symptoms: Pt calling requesting an RX for Librium, like she has had in the past for alcohol abuse. Pt states she is drinking a bottle of wine a night to cope with things going on her life right now. Triage offered and declined. Pt would just like first available appt with an MD in this practice. Prefers Dr. Derrel Nip but will see someone else if they are compassionate about her issues.  Reviewed Health History In EMR: Yes  Reviewed Medications In EMR: Yes  Reviewed Allergies In EMR: Yes  Reviewed Surgeries / Procedures: Yes  Date of Onset of Symptoms: 10/05/2013  Guideline(s) Used:  No Protocol Available - Sick Adult  Disposition Per Guideline:   See Today or Tomorrow in Office  Reason For Disposition Reached:   Nursing judgment  Advice Given:  N/A  Patient Will Follow Care Advice:  YES  Appointment Scheduled:  10/06/2013 13:00:00-no avail appt sooner with an MD with this practice Appointment Scheduled Provider:  Ronette Deter (Adults only)

## 2013-10-05 NOTE — Telephone Encounter (Signed)
Please advise, CAN has scheduled her an appt with Dr. Gilford Rile tomorrow.

## 2013-10-05 NOTE — Telephone Encounter (Signed)
Pt notified and verbalized understanding. Rx faxed to [facility]

## 2013-10-05 NOTE — Telephone Encounter (Signed)
I will refill the Librium but she needs to keep appt with Dr Gilford Rile tomorrow to discuss problem and consider SSRI therapy

## 2013-10-06 ENCOUNTER — Ambulatory Visit: Payer: Self-pay | Admitting: Internal Medicine

## 2013-10-07 ENCOUNTER — Encounter (INDEPENDENT_AMBULATORY_CARE_PROVIDER_SITE_OTHER): Payer: Self-pay

## 2013-10-07 ENCOUNTER — Encounter: Payer: Self-pay | Admitting: Internal Medicine

## 2013-10-07 ENCOUNTER — Ambulatory Visit (INDEPENDENT_AMBULATORY_CARE_PROVIDER_SITE_OTHER): Payer: BC Managed Care – PPO | Admitting: Internal Medicine

## 2013-10-07 VITALS — BP 136/82 | HR 80 | Temp 98.5°F | Wt 158.0 lb

## 2013-10-07 DIAGNOSIS — F1010 Alcohol abuse, uncomplicated: Secondary | ICD-10-CM

## 2013-10-07 DIAGNOSIS — M25579 Pain in unspecified ankle and joints of unspecified foot: Secondary | ICD-10-CM | POA: Insufficient documentation

## 2013-10-07 NOTE — Progress Notes (Signed)
Subjective:    Patient ID: Anne Hickman, female    DOB: August 23, 1950, 63 y.o.   MRN: [id number]  HPI 63YO female presents for acute visit.  Alcohol abuse - Started Librium taper yesterday. Feeling shaky today. No h/o DTs. Last alcoholic drink 4/03.  Counseling with [facility], Tim. Has been using alcohol to numb pain from recent foot injury. Has supportive husband. Notes stressors in her family. Previously drinking 1 bottle per day of wine. No hard liquor. Does not wish to partipicate in [place]. Does not want referral for counseling.  Followed by Dr. Tamala Julian after foot fracture. Continues to have some pain. Not taking anything for pain.  Review of Systems  Constitutional: Positive for fatigue.  Respiratory: Negative for shortness of breath.   Cardiovascular: Negative for chest pain.  Gastrointestinal: Positive for nausea. Negative for vomiting, abdominal pain, diarrhea, constipation and blood in stool.  Musculoskeletal: Positive for arthralgias (left foot after fracture).  Psychiatric/Behavioral: Positive for sleep disturbance and dysphoric mood. The patient is nervous/anxious.        Objective:    BP 136/82  Pulse 80  Temp(Src) 98.5 F (36.9 C) (Oral)  Wt 158 lb (71.668 kg)  SpO2 97% Physical Exam  Constitutional: She is oriented to person, place, and time. She appears well-developed and well-nourished. No distress.  HENT:  Head: Normocephalic and atraumatic.  Right Ear: External ear normal.  Left Ear: External ear normal.  Nose: Nose normal.  Mouth/Throat: Oropharynx is clear and moist. No oropharyngeal exudate.  Eyes: Conjunctivae are normal. Pupils are equal, round, and reactive to light. Right eye exhibits no discharge. Left eye exhibits no discharge. No scleral icterus.  Neck: Normal range of motion. Neck supple. No tracheal deviation present. No thyromegaly present.  Cardiovascular: Normal rate, regular rhythm, normal heart sounds and intact distal pulses.  Exam  reveals no gallop and no friction rub.   No murmur heard. Pulmonary/Chest: Effort normal and breath sounds normal. No accessory muscle usage. Not tachypneic. No respiratory distress. She has no decreased breath sounds. She has no wheezes. She has no rhonchi. She has no rales. She exhibits no tenderness.  Abdominal:  Pt declined abdominal exam  Musculoskeletal: Normal range of motion. She exhibits no edema and no tenderness.       Feet:  Lymphadenopathy:    She has no cervical adenopathy.  Neurological: She is alert and oriented to person, place, and time. No cranial nerve deficit. She exhibits normal muscle tone. Coordination normal.  Skin: Skin is warm and dry. No rash noted. She is not diaphoretic. No erythema. No pallor.  Psychiatric: Her speech is normal. Judgment and thought content normal. Her mood appears anxious. She is slowed. Cognition and memory are normal.          Assessment & Plan:   Problem List Items Addressed This Visit     Unprioritized   Alcohol abuse - Primary     Encouraged pt to continue with Librium taper (started yesterday). Encouraged continued abstinence from alcohol. Discussed importance of this, given elevated LFTs on recent labs 07/2013. Offered referral to behavioral health, given that pt is unhappy with current psychiatrist, but pt declines. Pt declines referral to AA. She will follow up with Dr. Derrel Nip next week.    Pain in joint, ankle and foot     Encouraged her to follow up with Dr. Tamala Julian regarding recent fracture. Encouraged her to avoid using Tylenol for pain given elevated LFTs.  Return in about 1 week (around 10/14/2013) for Recheck.

## 2013-10-07 NOTE — Progress Notes (Signed)
Pre visit review using our clinic review tool, if applicable. No additional management support is needed unless otherwise documented below in the visit note. 

## 2013-10-07 NOTE — Assessment & Plan Note (Signed)
Encouraged pt to continue with Librium taper (started yesterday). Encouraged continued abstinence from alcohol. Discussed importance of this, given elevated LFTs on recent labs 07/2013. Offered referral to behavioral health, given that pt is unhappy with current psychiatrist, but pt declines. Pt declines referral to AA. She will follow up with Dr. Derrel Nip next week.

## 2013-10-07 NOTE — Assessment & Plan Note (Signed)
Encouraged her to follow up with Dr. Tamala Julian regarding recent fracture. Encouraged her to avoid using Tylenol for pain given elevated LFTs.

## 2013-10-07 NOTE — Patient Instructions (Signed)
Continue with Librium taper.  Follow up next week.

## 2013-10-12 ENCOUNTER — Telehealth: Payer: Self-pay | Admitting: Internal Medicine

## 2013-10-12 MED ORDER — CLONIDINE HCL 0.1 MG PO TABS: 0.0500 mg | ORAL_TABLET | Freq: Two times a day (BID) | ORAL | Status: DC

## 2013-10-12 NOTE — Telephone Encounter (Signed)
Patient called and staed she feels as though she is still having alcohol withdrawals stated she is not drinking but one day post librium taper still feels very shaky.please advise.

## 2013-10-12 NOTE — Telephone Encounter (Signed)
If she took the taper as directed over 4-5 days, she will not withdraw. I can call in clonidine but I do not want to keep her on librium bc it is replacing one addictive medication for another

## 2013-10-12 NOTE — Telephone Encounter (Signed)
See unrouted message  

## 2013-10-12 NOTE — Telephone Encounter (Signed)
Patient notified and voiced understanding.

## 2013-10-17 ENCOUNTER — Ambulatory Visit: Payer: BC Managed Care – PPO | Admitting: Internal Medicine

## 2013-10-26 ENCOUNTER — Ambulatory Visit: Payer: BC Managed Care – PPO | Admitting: Internal Medicine

## 2013-10-31 ENCOUNTER — Ambulatory Visit: Payer: BC Managed Care – PPO | Admitting: Internal Medicine

## 2013-11-10 ENCOUNTER — Ambulatory Visit: Payer: BC Managed Care – PPO | Admitting: Internal Medicine

## 2013-11-22 ENCOUNTER — Ambulatory Visit: Payer: BC Managed Care – PPO | Admitting: Internal Medicine

## 2013-12-06 ENCOUNTER — Ambulatory Visit (INDEPENDENT_AMBULATORY_CARE_PROVIDER_SITE_OTHER): Payer: BC Managed Care – PPO | Admitting: Internal Medicine

## 2013-12-06 ENCOUNTER — Encounter: Payer: Self-pay | Admitting: Internal Medicine

## 2013-12-06 VITALS — BP 126/86 | HR 73 | Temp 98.0°F | Resp 16 | Ht 64.0 in | Wt 160.0 lb

## 2013-12-06 DIAGNOSIS — Z23 Encounter for immunization: Secondary | ICD-10-CM

## 2013-12-06 DIAGNOSIS — F1010 Alcohol abuse, uncomplicated: Secondary | ICD-10-CM

## 2013-12-06 DIAGNOSIS — R5383 Other fatigue: Principal | ICD-10-CM

## 2013-12-06 DIAGNOSIS — E039 Hypothyroidism, unspecified: Secondary | ICD-10-CM

## 2013-12-06 DIAGNOSIS — K7010 Alcoholic hepatitis without ascites: Secondary | ICD-10-CM

## 2013-12-06 DIAGNOSIS — R5381 Other malaise: Secondary | ICD-10-CM

## 2013-12-06 MED ORDER — CHLORDIAZEPOXIDE HCL 25 MG PO CAPS: ORAL_CAPSULE | ORAL | Status: DC

## 2013-12-06 MED ORDER — CLONIDINE HCL 0.1 MG PO TABS: 0.1000 mg | ORAL_TABLET | Freq: Two times a day (BID) | ORAL | Status: DC

## 2013-12-06 NOTE — Progress Notes (Signed)
Pre-visit discussion using our clinic review tool. No additional management support is needed unless otherwise documented below in the visit note.  
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
## 2013-12-07 ENCOUNTER — Other Ambulatory Visit: Payer: BC Managed Care – PPO

## 2013-12-08 ENCOUNTER — Other Ambulatory Visit: Payer: BC Managed Care – PPO

## 2013-12-08 NOTE — Assessment & Plan Note (Signed)
Repeat lfts today remain elevated.   Lab Results  Component Value Date   ALT 58* 07/18/2013   AST 151* 07/18/2013   ALKPHOS 119* 07/18/2013   BILITOT 1.4* 07/18/2013

## 2013-12-08 NOTE — Assessment & Plan Note (Signed)
Lab Results  Component Value Date   TSH 4.48 07/18/2013   . Thyroid function is WNL on current dose.  No current changes needed.

## 2013-12-08 NOTE — Assessment & Plan Note (Signed)
Refill on Librium taper given.  Strongly urged to attend AA in [place]. NO CHRONIC BENZOs discussed with patient today

## 2013-12-09 ENCOUNTER — Other Ambulatory Visit (INDEPENDENT_AMBULATORY_CARE_PROVIDER_SITE_OTHER): Payer: BC Managed Care – PPO

## 2013-12-09 DIAGNOSIS — R5383 Other fatigue: Secondary | ICD-10-CM

## 2013-12-09 DIAGNOSIS — R5381 Other malaise: Secondary | ICD-10-CM

## 2013-12-09 DIAGNOSIS — F1010 Alcohol abuse, uncomplicated: Secondary | ICD-10-CM

## 2013-12-09 LAB — COMPREHENSIVE METABOLIC PANEL
ALBUMIN: 3.5 g/dL (ref 3.5–5.2)
ALK PHOS: 138 U/L — AB (ref 39–117)
ALT: 55 U/L — ABNORMAL HIGH (ref 0–35)
AST: 122 U/L — ABNORMAL HIGH (ref 0–37)
BUN: 8 mg/dL (ref 6–23)
CALCIUM: 9 mg/dL (ref 8.4–10.5)
CHLORIDE: 100 meq/L (ref 96–112)
CO2: 30 mEq/L (ref 19–32)
Creatinine, Ser: 0.5 mg/dL (ref 0.4–1.2)
GFR: 133.21 mL/min (ref >60.00)
GLUCOSE: 106 mg/dL — AB (ref 70–99)
Potassium: 4 mEq/L (ref 3.5–5.1)
Sodium: 136 mEq/L (ref 135–145)
TOTAL PROTEIN: 7.3 g/dL (ref 6.0–8.3)
Total Bilirubin: 1 mg/dL (ref 0.2–1.2)

## 2013-12-09 LAB — CBC WITH DIFFERENTIAL/PLATELET
BASOS PCT: 0.6 % (ref 0.0–3.0)
Basophils Absolute: 0 10*3/uL (ref 0.0–0.1)
Eosinophils Absolute: 0.2 10*3/uL (ref 0.0–0.7)
Eosinophils Relative: 1.9 % (ref 0.0–5.0)
HEMATOCRIT: 41.5 % (ref 36.0–46.0)
Hemoglobin: 14 g/dL (ref 12.0–15.0)
Lymphocytes Relative: 29.7 % (ref 12.0–46.0)
Lymphs Abs: 2.5 10*3/uL (ref 0.7–4.0)
MCHC: 33.8 g/dL (ref 30.0–36.0)
MCV: 97.4 fl (ref 78.0–100.0)
MONO ABS: 1 10*3/uL (ref 0.1–1.0)
MONOS PCT: 11.7 % (ref 3.0–12.0)
NEUTROS PCT: 56.1 % (ref 43.0–77.0)
Neutro Abs: 4.6 10*3/uL (ref 1.4–7.7)
Platelets: 327 10*3/uL (ref 150.0–400.0)
RBC: 4.26 Mil/uL (ref 3.87–5.11)
RDW: 13.9 % (ref 11.5–15.5)
WBC: 8.3 10*3/uL (ref 4.0–10.5)

## 2013-12-09 LAB — LIPID PANEL
CHOLESTEROL: 130 mg/dL (ref 0–200)
HDL: 64 mg/dL (ref >39.00)
LDL Cholesterol: 47 mg/dL (ref 0–99)
NONHDL: 66
Total CHOL/HDL Ratio: 2
Triglycerides: 94 mg/dL (ref 0.0–149.0)
VLDL: 18.8 mg/dL (ref 0.0–40.0)

## 2013-12-09 LAB — MAGNESIUM: MAGNESIUM: 1.6 mg/dL (ref 1.5–2.5)

## 2013-12-09 LAB — TSH: TSH: 3.82 u[IU]/mL (ref 0.35–4.50)

## 2013-12-12 ENCOUNTER — Encounter: Payer: Self-pay | Admitting: *Deleted

## 2013-12-13 ENCOUNTER — Ambulatory Visit: Payer: BC Managed Care – PPO | Admitting: Internal Medicine

## 2013-12-19 ENCOUNTER — Telehealth: Payer: Self-pay | Admitting: Internal Medicine

## 2013-12-19 NOTE — Telephone Encounter (Signed)
Can we move her to Thursday afternoon?  I have a docto's appt in [place] at 4:15 on Wednesday, and that won't give me enough time

## 2013-12-19 NOTE — Telephone Encounter (Signed)
Left message for patient to return call to office. 

## 2013-12-19 NOTE — Telephone Encounter (Signed)
Patient has been rescheduled.

## 2013-12-19 NOTE — Telephone Encounter (Signed)
Called patient she will not be back in town until late tomorrow and the appointment is 3/15 Wednesday is that ok?

## 2013-12-19 NOTE — Telephone Encounter (Signed)
Patient has been notified and rescheduled as requested.

## 2013-12-19 NOTE — Telephone Encounter (Signed)
Can Anne Hickman come at [facility] tomorrow instead of at the end of the day?  No problem if she can't

## 2013-12-21 ENCOUNTER — Ambulatory Visit: Payer: BC Managed Care – PPO | Admitting: Internal Medicine

## 2013-12-22 ENCOUNTER — Ambulatory Visit: Payer: BC Managed Care – PPO | Admitting: Internal Medicine

## 2013-12-30 ENCOUNTER — Telehealth: Payer: Self-pay | Admitting: Internal Medicine

## 2013-12-30 NOTE — Telephone Encounter (Signed)
Patient stated she had seen Dr. Tamala Julian and was told she has sprained foot and ankle patient is complaint about swelling due to sprain and wanted Lasix to bring down swelling advised patient lasix will not help swelling due to sprain the only alternative is to elevate and ice foot.

## 2014-01-10 ENCOUNTER — Ambulatory Visit: Payer: BC Managed Care – PPO | Admitting: Internal Medicine

## 2014-01-27 ENCOUNTER — Ambulatory Visit: Payer: BC Managed Care – PPO | Admitting: Internal Medicine

## 2014-02-06 ENCOUNTER — Ambulatory Visit: Payer: BC Managed Care – PPO | Admitting: Internal Medicine

## 2014-02-23 ENCOUNTER — Ambulatory Visit: Payer: BC Managed Care – PPO | Admitting: Internal Medicine

## 2014-02-28 ENCOUNTER — Ambulatory Visit (INDEPENDENT_AMBULATORY_CARE_PROVIDER_SITE_OTHER): Payer: BC Managed Care – PPO | Admitting: Internal Medicine

## 2014-02-28 ENCOUNTER — Encounter: Payer: Self-pay | Admitting: Internal Medicine

## 2014-02-28 VITALS — BP 136/88 | HR 76 | Temp 97.7°F | Resp 16 | Ht 64.0 in | Wt 163.5 lb

## 2014-02-28 DIAGNOSIS — F1010 Alcohol abuse, uncomplicated: Secondary | ICD-10-CM

## 2014-02-28 DIAGNOSIS — R5383 Other fatigue: Secondary | ICD-10-CM

## 2014-02-28 DIAGNOSIS — K7010 Alcoholic hepatitis without ascites: Secondary | ICD-10-CM

## 2014-02-28 MED ORDER — TRAZODONE HCL 100 MG PO TABS: ORAL_TABLET | ORAL | Status: DC

## 2014-02-28 MED ORDER — CHLORDIAZEPOXIDE HCL 25 MG PO CAPS: ORAL_CAPSULE | ORAL | Status: DC

## 2014-02-28 MED ORDER — CLONIDINE HCL 0.1 MG PO TABS: 0.1000 mg | ORAL_TABLET | Freq: Two times a day (BID) | ORAL | Status: DC

## 2014-02-28 MED ORDER — LEVOTHYROXINE SODIUM 50 MCG PO TABS: ORAL_TABLET | ORAL | Status: DC

## 2014-02-28 NOTE — Progress Notes (Signed)
Pre-visit discussion using our clinic review tool. No additional management support is needed unless otherwise documented below in the visit note.  

## 2014-02-28 NOTE — Patient Instructions (Signed)
Please start the librium taper , trazodone, and clonidine tonight   We will check your B12 level and liver tests in a month
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
## 2014-03-01 ENCOUNTER — Encounter: Payer: Self-pay | Admitting: Internal Medicine

## 2014-03-01 NOTE — Assessment & Plan Note (Addendum)
Spent 30 minutes with patient and husband identifying triggers and enabling behavior.  Refill on Librium taper given.  Clonidine for elevated pulse and BP. Strongly urged to attend AA in [place].

## 2014-03-15 ENCOUNTER — Telehealth: Payer: Self-pay

## 2014-03-15 MED ORDER — FLUNISOLIDE HFA 80 MCG/ACT IN AERS: 1.0000 | INHALATION_SPRAY | Freq: Two times a day (BID) | RESPIRATORY_TRACT | Status: DC

## 2014-03-15 MED ORDER — ALPRAZOLAM ER 1 MG PO TB24: ORAL_TABLET | ORAL | Status: DC

## 2014-03-15 NOTE — Telephone Encounter (Signed)
The patient called and is hoping to get a refill of her "time release" xanax rx and she would like an rx for the aerospan inhaler (she received a sample and stated it is working well)   Pt callback - [phone]

## 2014-03-15 NOTE — Telephone Encounter (Signed)
Ok to refill,  printed rx  

## 2014-03-15 NOTE — Telephone Encounter (Signed)
Script faxed patient notified. 

## 2014-03-15 NOTE — Telephone Encounter (Signed)
Please advise 

## 2014-04-12 ENCOUNTER — Other Ambulatory Visit: Payer: BC Managed Care – PPO

## 2014-04-14 ENCOUNTER — Telehealth: Payer: Self-pay | Admitting: Internal Medicine

## 2014-04-17 ENCOUNTER — Telehealth: Payer: Self-pay | Admitting: Internal Medicine

## 2014-04-17 NOTE — Telephone Encounter (Signed)
Spoke with pt, she states she did have a set back over the holiday and is drinking.  She states she is going to the meetings in [place].  Please advise

## 2014-04-17 NOTE — Telephone Encounter (Signed)
[name] ,  Please call Ms Treadwell and find out why she is reqesting refill on the Librium taper, is she drinking again?

## 2014-04-17 NOTE — Telephone Encounter (Signed)
Advise refill

## 2014-04-18 MED ORDER — LEVOCETIRIZINE DIHYDROCHLORIDE 5 MG PO TABS: 5.0000 mg | ORAL_TABLET | Freq: Every evening | ORAL | Status: DC

## 2014-04-18 NOTE — Telephone Encounter (Deleted)
The previous phone note was written in error.  It should have been applied to the 04/17/14 prescription request.

## 2014-04-18 NOTE — Telephone Encounter (Signed)
Pt notified and  verbalized understanding. Faxed to pharmacy.  

## 2014-04-18 NOTE — Addendum Note (Signed)
Addended by: Crecencio Mc on: 04/18/2014 10:18 AM   Modules accepted: Orders

## 2014-04-18 NOTE — Telephone Encounter (Signed)
Please call patient and remind her that she cannot drink alcohol while she is taking this medication.  You MUST document that she understands this before releasing the medicatino

## 2014-04-18 NOTE — Telephone Encounter (Signed)
°  Patient called again saying she really needs this medication. She states she cannot drive or work without it.

## 2014-04-18 NOTE — Telephone Encounter (Deleted)
Patient called again saying she really needs this medication.  She states she cannot drive or work without it.

## 2014-04-18 NOTE — Telephone Encounter (Signed)
Left message for pt to return my call.

## 2014-04-18 NOTE — Telephone Encounter (Signed)
Ok to refill,  Refill sent  

## 2014-05-22 ENCOUNTER — Telehealth: Payer: Self-pay | Admitting: Internal Medicine

## 2014-05-22 MED ORDER — CHLORDIAZEPOXIDE HCL 25 MG PO CAPS: ORAL_CAPSULE | ORAL | Status: DC

## 2014-05-22 NOTE — Telephone Encounter (Signed)
Script faxed patient notified. 
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
## 2014-05-22 NOTE — Telephone Encounter (Signed)
Please advise 

## 2014-05-22 NOTE — Telephone Encounter (Signed)
Ok to refill,  printed rx  

## 2014-07-04 ENCOUNTER — Other Ambulatory Visit: Payer: Self-pay | Admitting: Internal Medicine

## 2014-07-06 ENCOUNTER — Ambulatory Visit (INDEPENDENT_AMBULATORY_CARE_PROVIDER_SITE_OTHER): Payer: BLUE CROSS/BLUE SHIELD | Admitting: Nurse Practitioner

## 2014-07-06 ENCOUNTER — Encounter: Payer: Self-pay | Admitting: Nurse Practitioner

## 2014-07-06 VITALS — BP 138/86 | HR 71 | Temp 97.4°F | Resp 16 | Ht 64.0 in | Wt 158.4 lb

## 2014-07-06 DIAGNOSIS — R590 Localized enlarged lymph nodes: Secondary | ICD-10-CM

## 2014-07-06 DIAGNOSIS — R5382 Chronic fatigue, unspecified: Secondary | ICD-10-CM

## 2014-07-06 DIAGNOSIS — R42 Dizziness and giddiness: Secondary | ICD-10-CM

## 2014-07-06 DIAGNOSIS — K7010 Alcoholic hepatitis without ascites: Secondary | ICD-10-CM

## 2014-07-06 DIAGNOSIS — R5383 Other fatigue: Secondary | ICD-10-CM

## 2014-07-06 LAB — CBC WITH DIFFERENTIAL/PLATELET
Basophils Absolute: 0.1 10*3/uL (ref 0.0–0.1)
Basophils Relative: 0.8 % (ref 0.0–3.0)
EOS PCT: 1.1 % (ref 0.0–5.0)
Eosinophils Absolute: 0.1 10*3/uL (ref 0.0–0.7)
HEMATOCRIT: 41.7 % (ref 36.0–46.0)
Hemoglobin: 14.2 g/dL (ref 12.0–15.0)
Lymphocytes Relative: 43.4 % (ref 12.0–46.0)
Lymphs Abs: 3.4 10*3/uL (ref 0.7–4.0)
MCHC: 34.1 g/dL (ref 30.0–36.0)
MCV: 95.1 fl (ref 78.0–100.0)
MONOS PCT: 9.4 % (ref 3.0–12.0)
Monocytes Absolute: 0.7 10*3/uL (ref 0.1–1.0)
NEUTROS PCT: 45.3 % (ref 43.0–77.0)
Neutro Abs: 3.6 10*3/uL (ref 1.4–7.7)
PLATELETS: 204 10*3/uL (ref 150.0–400.0)
RBC: 4.39 Mil/uL (ref 3.87–5.11)
RDW: 14.4 % (ref 11.5–15.5)
WBC: 7.9 10*3/uL (ref 4.0–10.5)

## 2014-07-06 LAB — COMPREHENSIVE METABOLIC PANEL
ALBUMIN: 4 g/dL (ref 3.5–5.2)
ALT: 46 U/L — ABNORMAL HIGH (ref 0–35)
AST: 153 U/L — AB (ref 0–37)
Alkaline Phosphatase: 248 U/L — ABNORMAL HIGH (ref 39–117)
BUN: 6 mg/dL (ref 6–23)
CALCIUM: 9.3 mg/dL (ref 8.4–10.5)
CO2: 26 mEq/L (ref 19–32)
Chloride: 96 mEq/L (ref 96–112)
Creatinine, Ser: 0.46 mg/dL (ref 0.40–1.20)
GFR: 146.39 mL/min (ref >60.00)
Glucose, Bld: 93 mg/dL (ref 70–99)
POTASSIUM: 4.4 meq/L (ref 3.5–5.1)
SODIUM: 133 meq/L — AB (ref 135–145)
Total Bilirubin: 1.2 mg/dL (ref 0.2–1.2)
Total Protein: 8.5 g/dL — ABNORMAL HIGH (ref 6.0–8.3)

## 2014-07-06 LAB — VITAMIN B12: Vitamin B-12: 373 pg/mL (ref 211–911)

## 2014-07-06 MED ORDER — DOXYCYCLINE HYCLATE 100 MG PO TABS: 100.0000 mg | ORAL_TABLET | Freq: Two times a day (BID) | ORAL | Status: DC

## 2014-07-06 NOTE — Progress Notes (Signed)
Pre visit review using our clinic review tool, if applicable. No additional management support is needed unless otherwise documented below in the visit note. 

## 2014-07-06 NOTE — Progress Notes (Signed)
Subjective:    Patient ID: Anne Hickman, female    DOB: January 04, 1951, 64 y.o.   MRN: [id number]  HPI  [name] is a 64 yo female with a CC of right ear pain x 2 weeks, vertigo, and sinus infection.  1) Last visit wax build up, earwax removal system- two days last week used warm water, Amoxicillin 2 caps twice daily x 10 days, taken all, flonase once a day in each nostril for 10 days.   Dizzy, right ear pain, hearing loss- 2 weeks   Dizzy- does not feel the room is spinning, feels off balance   Patient became upset and started to curse when discussing possible treatments and blood work. Patient's husband was trying to calm her down. I did not ask about if she has recently been using alcohol again due to her belligerent state.    Review of Systems  Constitutional: Positive for appetite change and fatigue. Negative for fever, chills and diaphoresis.  HENT: Positive for ear pain and hearing loss. Negative for congestion, ear discharge, postnasal drip and rhinorrhea.   Eyes: Negative for visual disturbance.  Respiratory: Negative for chest tightness, shortness of breath and wheezing.   Gastrointestinal: Negative for nausea, vomiting and diarrhea.  Skin: Negative for rash.  Neurological: Positive for dizziness.  Psychiatric/Behavioral: The patient is nervous/anxious.    Past Medical History  Diagnosis Date  . Insomnia   . Generalized anxiety disorder   . Other and unspecified hyperlipidemia   . Unspecified hypothyroidism   . Alcohol abuse, episodic   . Eczema herpeticum     History   Social History  . Marital Status: Married    Spouse Name: N/A  . Number of Children: 4  . Years of Education: N/A   Occupational History  . Full time    Social History Main Topics  . Smoking status: Never Smoker   . Smokeless tobacco: Never Used  . Alcohol Use: No     Comment: 1- 2 bottles of wine a day  . Drug Use: No  . Sexual Activity: Not on file   Other Topics Concern  . Not  on file   Social History Narrative   Lives with spouse.    Past Surgical History  Procedure Laterality Date  . Appendectomy  1978    Family History  Problem Relation Age of Onset  . Coronary artery disease Father   . Heart disease Father   . Breast cancer Maternal Aunt   . Mental illness Mother     Allergies  Allergen Reactions  . Betadine [Povidone Iodine]   . Codeine     Current Outpatient Prescriptions on File Prior to Visit  Medication Sig Dispense Refill  . ALPRAZolam (ALPRAZOLAM XR) 1 MG 24 hr tablet TAKE 1 TABLET BY MOUTH IN THE MORNING 30 tablet 1  . benzonatate (TESSALON) 200 MG capsule Take 1 capsule (200 mg total) by mouth 3 (three) times daily as needed for cough. 60 capsule 1  . chlordiazePOXIDE (LIBRIUM) 25 MG capsule Take 1 capsule by mouth 4 times daily for 1 day. Then 1 capsule 3 times daily for 1 day. Then 1 capsule twice daily for 1 day. Then 1 capsul 11 capsule 0  . cloNIDine (CATAPRES) 0.1 MG tablet Take 1 tablet (0.1 mg total) by mouth 2 (two) times daily. 60 tablet 0  . Flunisolide HFA 80 MCG/ACT AERS Inhale 1 puff into the lungs 2 (two) times daily. 5.1 g 11  . levocetirizine (XYZAL) 5  MG tablet Take 1 tablet (5 mg total) by mouth every evening. 30 tablet 8  . levothyroxine (SYNTHROID, LEVOTHROID) 50 MCG tablet TAKE ONE TABLET BY MOUTH ONE TIME DAILY  30 tablet 6  . Magnesium Oxide 400 MG CAPS Take 1 capsule (400 mg total) by mouth 2 (two) times daily. 30 capsule 1  . traZODone (DESYREL) 100 MG tablet TAKE 1 TABLET (100 MG TOTAL) BY MOUTH AT BEDTIME. 30 tablet 3   No current facility-administered medications on file prior to visit.      Objective:   Physical Exam  Constitutional: She is oriented to person, place, and time. She appears well-developed and well-nourished. No distress.  BP 138/86 mmHg  Pulse 71  Temp(Src) 97.4 F (36.3 C) (Oral)  Resp 16  Ht 5\' 4"  (1.626 m)  Wt 158 lb 6.4 oz (71.85 kg)  BMI 27.18 kg/m2  SpO2 97%   HENT:  Head:  Normocephalic and atraumatic.  Right Ear: External ear normal.  Left Ear: External ear normal.  Right TM clear, not injected, no abnormalities seen on exam  Eyes: EOM are normal. Pupils are equal, round, and reactive to light. Right eye exhibits no discharge. Left eye exhibits no discharge. No scleral icterus.  Neck: Normal range of motion. Neck supple. No thyromegaly present.  Right side swollen gland  Cardiovascular: Normal rate, regular rhythm, normal heart sounds and intact distal pulses.  Exam reveals no gallop and no friction rub.   No murmur heard. Pulmonary/Chest: Effort normal and breath sounds normal. No respiratory distress. She has no wheezes. She has no rales. She exhibits no tenderness.  Lymphadenopathy:    She has cervical adenopathy.  Neurological: She is alert and oriented to person, place, and time. No cranial nerve deficit. She exhibits normal muscle tone. Coordination normal.  Skin: Skin is warm and dry. She is not diaphoretic.  Psychiatric: She has a normal mood and affect. Her behavior is normal. Judgment and thought content normal.      Assessment & Plan:

## 2014-07-06 NOTE — Patient Instructions (Signed)
Please visit the lab before leaving today we will contact you with your results.

## 2014-07-07 ENCOUNTER — Telehealth: Payer: Self-pay | Admitting: *Deleted

## 2014-07-07 LAB — FOLATE RBC: RBC Folate: 581 ng/mL (ref >280)

## 2014-07-07 NOTE — Telephone Encounter (Signed)
Pt called states she was seen by Morey Hummingbird on 2.18.16.  Pt is requesting lab results.  Pt also wants to discuss her swollen lymph node.  Please advise

## 2014-07-09 DIAGNOSIS — R42 Dizziness and giddiness: Secondary | ICD-10-CM | POA: Insufficient documentation

## 2014-07-09 DIAGNOSIS — R590 Localized enlarged lymph nodes: Secondary | ICD-10-CM | POA: Insufficient documentation

## 2014-07-09 NOTE — Assessment & Plan Note (Signed)
Right side cervical lymphadenopathy. Stable. Will treat with doxycyline for possible infection lingering. 100 mg twice daily x 5 days. Pt was upset with this as well and stated to a staff member outside of the room that I probably couldn't send the prescription correctly because I am incompetent.

## 2014-07-09 NOTE — Assessment & Plan Note (Signed)
Pt citing right ear pain unknown cause. Structures look to be normal. Asked if she wanted to see ENT and she became upset and said "this is bullshit" and wanted to leave. She stated I was not going to do anything for her and I was incompetent. Her husband was trying to calm her down. She would not sit down. At this point I escorted her and husband to the lab. Want to obtain previously ordered labs by Dr. Derrel Nip for B12 and folate. I wanted to obtain CMET and CBC w/ diff to check for residual infection or other concerns.

## 2014-07-09 NOTE — Assessment & Plan Note (Signed)
CMET repeated today.

## 2014-07-10 ENCOUNTER — Encounter: Payer: Self-pay | Admitting: *Deleted

## 2014-07-24 ENCOUNTER — Encounter: Payer: Self-pay | Admitting: *Deleted

## 2014-07-24 ENCOUNTER — Encounter: Payer: Self-pay | Admitting: Internal Medicine

## 2014-07-24 NOTE — Progress Notes (Signed)
Letter of dismissal requested by MD for rudeness to staff at visit on 07/06/14, Lorane Gell NP) . Letter written by nurse and signed by requesting MD. All paperwork placed and given to [facility].

## 2014-07-25 ENCOUNTER — Telehealth: Payer: Self-pay | Admitting: Internal Medicine

## 2014-07-25 NOTE — Telephone Encounter (Addendum)
Patient dismissed from [facility] by Deborra Medina, M.D. effective July 24, 2014.  Dismissal letter sent out by certified / registered mail on July 25, 2014. [place]  Received signed domestic return receipt verifying delivery of certified letter on July 31, 2014. Article number [id number]

## 2014-07-27 ENCOUNTER — Other Ambulatory Visit: Payer: Self-pay | Admitting: Internal Medicine

## 2014-07-27 MED ORDER — CHLORDIAZEPOXIDE HCL 25 MG PO CAPS: ORAL_CAPSULE | ORAL | Status: DC

## 2014-07-27 NOTE — Telephone Encounter (Signed)
Recommendation to a physician in [place]   levothyroxine (SYNTHROID, LEVOTHROID) 50 MCG tablet  chlordiazePOXIDE (LIBRIUM) 25 MG capsule

## 2014-07-27 NOTE — Telephone Encounter (Signed)
Levothyroxine has refills available.  Pt spoke to Sherrill, she did receive dismissal letter. She notified pt that there was a telephone number on letter that she could call to find other physicians in the area.  Refill Librium?

## 2014-07-28 NOTE — Telephone Encounter (Signed)
Faxed to pharmacy

## 2014-09-05 ENCOUNTER — Emergency Department: Admit: 2014-09-06

## 2014-09-05 DIAGNOSIS — K922 Gastrointestinal hemorrhage, unspecified: Secondary | ICD-10-CM

## 2014-09-05 NOTE — ED Provider Notes (Signed)
HPI:  09/05/14,   Time: 11:33 PM         Nancy Fernandez is a 64 y.o. female presenting to the ED for RECTAL Bleeding, beginning today ago.  The complaint has been persistent, mild in severity, and worsened by nothing.  Patient stated she noticed she had black tarry stools today. She did state that the week ago she fell and injured her right shoulder. She's been taking NSAIDs every 4 hours. Patient stated that she is recovering from alcohol abuse. She did not take any alcohol recently. She denies any history of gastritis or peptic ulcers. She's never had a GI bleed in the past. She denies chest pain shortness of breath or dizziness.    ROS:   Pertinent positives and negatives are stated within HPI, all other systems reviewed and are negative.  --------------------------------------------- PAST HISTORY ---------------------------------------------  Past Medical History:  has a past medical history of Thyroid disease.    Past Surgical History:  has past surgical history that includes Appendectomy.    Social History:  reports that she has never smoked. She does not have any smokeless tobacco history on file. She reports that she does not drink alcohol or use illicit drugs.    Family History: family history is not on file.     The patient???s home medications have been reviewed.    Allergies: Codeine    -------------------------------------------------- RESULTS -------------------------------------------------  All laboratory and radiology results have been personally reviewed by myself   LABS:  Results for orders placed or performed during the hospital encounter of 09/05/14   CBC auto differential   Result Value Ref Range    WBC 10.9 4.5 - 11.5 E9/L    RBC 3.20 (L) 3.50 - 5.50 E12/L    Hemoglobin 10.6 (L) 11.5 - 15.5 g/dL    Hematocrit 95.6 (L) 34.0 - 48.0 %    MCV 99.1 80.0 - 99.9 fL    MCH 33.2 26.0 - 35.0 pg    MCHC 33.5 32.0 - 34.5 %    RDW 15.6 (H) 11.5 - 15.0 fL    Platelets 222 130 - 450 E9/L    MPV 10.3 7.0 -  12.0 fL    PLATELET SLIDE REVIEW Normal     Neutrophils Relative 62 43 - 80 %    Lymphocytes Relative 24 20 - 42 %    Monocytes Relative 12 2 - 12 %    Eosinophils Relative Percent 1 0 - 6 %    Basophils Relative 1 0 - 2 %    Neutrophils Absolute 6.76 1.80 - 7.30 E9/L    Lymphocytes Absolute 2.62 1.50 - 4.00 E9/L    Monocytes Absolute 1.31 (H) 0.10 - 0.95 E9/L    Eosinophils Absolute 0.11 0.05 - 0.50 E9/L    Basophils Absolute 0.11 0.00 - 0.20 E9/L    Anisocytosis 1+     Hypochromia 1+    Comprehensive metabolic panel   Result Value Ref Range    Sodium 133 132 - 146 mmol/L    Potassium 5.1 (H) 3.5 - 5.0 mmol/L    Chloride 95 (L) 98 - 107 mmol/L    CO2 25 22 - 29 mmol/L    Anion Gap 13 7 - 16 mmol/L    Glucose 102 74 - 109 mg/dL    BUN 23 8 - 23 mg/dL    CREATININE 0.3 (L) 0.5 - 1.0 mg/dL    GFR Non-African American >60 >=60 mL/min/1.73    GFR African American >  60     Calcium 8.9 8.6 - 10.2 mg/dL    Total Protein 7.3 6.4 - 8.3 g/dL    Alb 3.0 (L) 3.5 - 5.2 g/dL    Total Bilirubin 4.9 (H) 0.0 - 1.2 mg/dL    Alkaline Phosphatase 273 (H) 35 - 104 U/L    ALT 43 (H) 0 - 32 U/L    AST 171 (H) 0 - 31 U/L       RADIOLOGY:  Interpreted by Radiologist.  US ABDOMINAL LIMITED    (Results Pending)       ------------------------- NURSING NOTES AND VITALS REVIEWED ---------------------------   The nursing notes within the ED encounter and vital signs as below have been reviewed.   BP 129/75 mmHg   Pulse 81   Temp(Src) 97.6 ??F (36.4 ??C) (Oral)   Resp 18   Ht  (1.626 m)   Wt 160 lb (72.576 kg)   BMI 27.45 kg/m2   SpO2 99%  Oxygen Saturation Interpretation: Normal      ---------------------------------------------------PHYSICAL EXAM--------------------------------------      Constitutional/General: Alert and oriented x3, well appearing, non toxic in NAD  Head: NC/AT  Eyes: PERRL, EOMI, conjunctiva appears mildly pale and icteric.  Mouth: Oropharynx clear, handling secretions, no trismus  Neck: Supple, full ROM, no meningeal  signs  Pulmonary: Lungs clear to auscultation bilaterally, no wheezes, rales, or rhonchi. Not in respiratory distress  Cardiovascular:  Regular rate and rhythm, no murmurs, gallops, or rubs. 2+ distal pulses  Abdomen: Soft, non tender, non distended, no rebound tenderness or guarding.   Extremities: Moves all extremities x 4. Warm and well perfused  Skin: warm and dry without rash  Neurologic: GCS 15,  Psych: Normal Affect      ------------------------------ ED COURSE/MEDICAL DECISION MAKING----------------------  Medications - No data to display      Medical Decision Making:    ACEs H&H is stable. Patient refuses rectal exam either by myself or nursing staff. Patient also refuses hospital admission for presumptive upper GI bleed. She'll be given prescriptions for Protonix and anti-medics. She was instructed to return to emergency department if she changes her mind regarding workup for her presumptive a GI bleed. She will leave against medical advice. Currently her vital signs are stable at the time of discharge against medical advice.    09/06/14 / Time:  1201 12:01 AM.  This patient has chosen to leave against medical advice.  I have personally explained to them that choosing to do so may result in permanent bodily harm or death.  I discussed at length that without further evaluation and monitoring there may be unforeseen circumstances and deterioration resulting in permanent bodily harm or death as a result of their choice.    They are alert, oriented, and competent at this time.  They state that they are aware of the serious risks as explained, but they continue to wish to leave against medical advice.    In light of their decision to leave against medical advice, follow-up has been arranged and they are aware of the importance of following up as instructed.  They have been advised that they should return to the ED immediately if they change their mind at any time, or if their condition begins to change or  worsen.     ------------------------------------------------------------------------------------------    Counseling:   The emergency provider has spoken with the patient and spouse/SO and discussed today???s results, in addition to providing specific details for the plan of care and counseling  regarding the diagnosis and prognosis.  Questions are answered at this time and they are agreeable with the plan.      --------------------------------- IMPRESSION AND DISPOSITION ---------------------------------    IMPRESSION  1. Acute upper GI bleed        DISPOSITION  Disposition: Other Disposition: Left AMA  Patient condition is stable                Romero BellingJoseph Ilyse Tremain, MD  09/06/14 [id number]

## 2014-09-05 NOTE — ED Notes (Signed)
Monica from lab called and stated the blue top was not filled enough and was hemolyzed, will need to reorder and redraw.    Durenda GuthrieKilani Amabile, RN  09/05/14 [id number]

## 2014-09-06 ENCOUNTER — Inpatient Hospital Stay: Admit: 2014-09-06 | Discharge: 2014-09-06 | Discharge status: Against Medical Advice | Attending: Emergency Medicine

## 2014-09-06 LAB — COMPREHENSIVE METABOLIC PANEL
ALT: 43 U/L — ABNORMAL HIGH (ref 0–32)
AST: 171 U/L — ABNORMAL HIGH (ref 0–31)
Albumin: 3 g/dL — ABNORMAL LOW (ref 3.5–5.2)
Alkaline Phosphatase: 273 U/L — ABNORMAL HIGH (ref 35–104)
Anion Gap: 13 mmol/L (ref 7–16)
BUN: 23 mg/dL (ref 8–23)
CO2: 25 mmol/L (ref 22–29)
Calcium: 8.9 mg/dL (ref 8.6–10.2)
Chloride: 95 mmol/L — ABNORMAL LOW (ref 98–107)
Creatinine: 0.3 mg/dL — ABNORMAL LOW (ref 0.5–1.0)
GFR African American: >60
GFR Non-African American: >60 mL/min/{1.73_m2} (ref >=60)
Glucose: 102 mg/dL (ref 74–109)
Potassium: 5.1 mmol/L — ABNORMAL HIGH (ref 3.5–5.0)
Sodium: 133 mmol/L (ref 132–146)
Total Bilirubin: 4.9 mg/dL — ABNORMAL HIGH (ref 0.0–1.2)
Total Protein: 7.3 g/dL (ref 6.4–8.3)

## 2014-09-06 LAB — CBC WITH AUTO DIFFERENTIAL
Basophils %: 1 % (ref 0–2)
Basophils Absolute: 0.11 E9/L (ref 0.00–0.20)
Eosinophils %: 1 % (ref 0–6)
Eosinophils Absolute: 0.11 E9/L (ref 0.05–0.50)
Hematocrit: 31.7 % — ABNORMAL LOW (ref 34.0–48.0)
Hemoglobin: 10.6 g/dL — ABNORMAL LOW (ref 11.5–15.5)
Lymphocytes %: 24 % (ref 20–42)
Lymphocytes Absolute: 2.62 E9/L (ref 1.50–4.00)
MCH: 33.2 pg (ref 26.0–35.0)
MCHC: 33.5 % (ref 32.0–34.5)
MCV: 99.1 fL (ref 80.0–99.9)
MPV: 10.3 fL (ref 7.0–12.0)
Monocytes %: 12 % (ref 2–12)
Monocytes Absolute: 1.31 E9/L — ABNORMAL HIGH (ref 0.10–0.95)
Neutrophils %: 62 % (ref 43–80)
Neutrophils Absolute: 6.76 E9/L (ref 1.80–7.30)
PLATELET SLIDE REVIEW: NORMAL
Platelets: 222 E9/L (ref 130–450)
RBC: 3.2 E12/L — ABNORMAL LOW (ref 3.50–5.50)
RDW: 15.6 fL — ABNORMAL HIGH (ref 11.5–15.0)
WBC: 10.9 E9/L (ref 4.5–11.5)

## 2014-09-06 MED ORDER — PANTOPRAZOLE SODIUM 40 MG PO TBEC
40 MG | ORAL_TABLET | Freq: Every day | ORAL | Status: AC
Start: 2014-09-06 — End: 2014-09-16

## 2014-09-08 NOTE — Op Note (Signed)
PATIENT NAME:  Anne Hickman, Anne Hickman MR#:  [id number] DATE OF BIRTH:  1950-12-10  DATE OF PROCEDURE:  07/02/2012  PREOPERATIVE DIAGNOSIS: Mass of volar distal right forearm and wrist.   POSTOPERATIVE DIAGNOSIS:  A large ganglion cyst, distal right forearm and wrist.   PROCEDURE: Excision of ganglion cyst, distal forearm and wrist.   SURGEON: Lucas Mallow, M.D.   ANESTHESIA: General.   COMPLICATIONS: None.   TOURNIQUET TIME: Approximately 50 minutes.   PROCEDURE: After adequate induction of general anesthesia, the right upper extremity is thoroughly prepped with alcohol and ChloraPrep and draped in standard sterile fashion. The extremity is wrapped out with the Esmarch bandage and pneumatic tourniquet elevated to 250 mmHg. Under loupe magnification, a longitudinal incision is made over the prominence of the mass in the distal volar forearm. The dissection is carefully carried down, and proximally the radial artery and vein are identified, and these are seen to be strictly adherent to the mass which is seen to be a rather large ganglion. The radial artery and vein are carefully dissected off of the mass using the loupe magnification. The stalk of the ganglion is then followed distally and is seen to communicate directly with the wrist joint at the radioscaphoid area. The stalk and the ganglion are completely resected along with a 1 cm in diameter portion of volar wrist capsule. Rongeurs were used to remove any other remaining material. A careful check for any residual ganglion is made and none is seen. The wound is thoroughly irrigated multiple times. The skin edges are infiltrated with 0.5% plain Marcaine. The subcutaneous tissue is closed with several 5-0 Vicryl sutures. The skin is closed with a running subcuticular 3-0 Prolene. The sterile dressing is applied along with a volar splint. The tourniquet is released. The patient is returned to the Recovery Room in satisfactory condition having  tolerated the procedure quite well.  ____________________________ Lucas Mallow, MD ces:jm D: 07/03/2012 11:29:15 ET T: 07/03/2012 13:38:19 ET JOB#: [id number]  cc: Lucas Mallow, MD, <Dictator> Lucas Mallow MD ELECTRONICALLY SIGNED 07/04/2012 11:18

## 2014-09-22 DIAGNOSIS — Z8719 Personal history of other diseases of the digestive system: Secondary | ICD-10-CM | POA: Insufficient documentation

## 2014-09-22 DIAGNOSIS — K7030 Alcoholic cirrhosis of liver without ascites: Secondary | ICD-10-CM | POA: Insufficient documentation

## 2014-09-22 DIAGNOSIS — D649 Anemia, unspecified: Secondary | ICD-10-CM | POA: Insufficient documentation

## 2014-09-22 DIAGNOSIS — K8020 Calculus of gallbladder without cholecystitis without obstruction: Secondary | ICD-10-CM | POA: Insufficient documentation

## 2014-09-25 ENCOUNTER — Other Ambulatory Visit: Payer: Self-pay

## 2014-09-25 DIAGNOSIS — Z1231 Encounter for screening mammogram for malignant neoplasm of breast: Secondary | ICD-10-CM

## 2014-09-29 ENCOUNTER — Ambulatory Visit: Payer: BLUE CROSS/BLUE SHIELD | Admitting: Hematology and Oncology

## 2014-10-04 ENCOUNTER — Ambulatory Visit
Admission: RE | Admit: 2014-10-04 | Discharge: 2014-10-04 | Disposition: A | Discharge status: Home / Self Care | Payer: BLUE CROSS/BLUE SHIELD | Source: Ambulatory Visit | Attending: Internal Medicine | Admitting: Internal Medicine

## 2014-10-04 ENCOUNTER — Encounter (INDEPENDENT_AMBULATORY_CARE_PROVIDER_SITE_OTHER): Payer: Self-pay

## 2014-10-04 DIAGNOSIS — Z1231 Encounter for screening mammogram for malignant neoplasm of breast: Secondary | ICD-10-CM | POA: Insufficient documentation

## 2014-10-05 ENCOUNTER — Encounter: Payer: Self-pay | Admitting: Internal Medicine

## 2014-10-12 ENCOUNTER — Inpatient Hospital Stay: Payer: BLUE CROSS/BLUE SHIELD

## 2014-10-12 ENCOUNTER — Inpatient Hospital Stay: Payer: BLUE CROSS/BLUE SHIELD | Attending: Hematology and Oncology | Admitting: Hematology and Oncology

## 2014-10-12 ENCOUNTER — Encounter: Payer: Self-pay | Admitting: Hematology and Oncology

## 2014-10-12 VITALS — BP 142/91 | HR 72 | Temp 97.9°F | Resp 18 | Ht 65.0 in | Wt 144.1 lb

## 2014-10-12 DIAGNOSIS — Z79899 Other long term (current) drug therapy: Secondary | ICD-10-CM | POA: Diagnosis not present

## 2014-10-12 DIAGNOSIS — F1010 Alcohol abuse, uncomplicated: Secondary | ICD-10-CM | POA: Insufficient documentation

## 2014-10-12 DIAGNOSIS — K921 Melena: Secondary | ICD-10-CM | POA: Diagnosis not present

## 2014-10-12 DIAGNOSIS — E039 Hypothyroidism, unspecified: Secondary | ICD-10-CM | POA: Diagnosis not present

## 2014-10-12 DIAGNOSIS — D649 Anemia, unspecified: Secondary | ICD-10-CM | POA: Diagnosis not present

## 2014-10-12 DIAGNOSIS — E785 Hyperlipidemia, unspecified: Secondary | ICD-10-CM

## 2014-10-12 DIAGNOSIS — D473 Essential (hemorrhagic) thrombocythemia: Secondary | ICD-10-CM | POA: Insufficient documentation

## 2014-10-12 LAB — CBC WITH DIFFERENTIAL/PLATELET
Basophils Absolute: 0.1 10*3/uL (ref 0–0.1)
Basophils Relative: 1 %
Eosinophils Absolute: 0 10*3/uL (ref 0–0.7)
Eosinophils Relative: 0 %
HCT: 39.7 % (ref 35.0–47.0)
Hemoglobin: 13.3 g/dL (ref 12.0–16.0)
Lymphocytes Relative: 34 %
Lymphs Abs: 3.3 10*3/uL (ref 1.0–3.6)
MCH: 32.3 pg (ref 26.0–34.0)
MCHC: 33.4 g/dL (ref 32.0–36.0)
MCV: 96.7 fL (ref 80.0–100.0)
Monocytes Absolute: 0.6 10*3/uL (ref 0.2–0.9)
Monocytes Relative: 6 %
Neutro Abs: 5.6 10*3/uL (ref 1.4–6.5)
Neutrophils Relative %: 59 %
Platelets: 436 10*3/uL (ref 150–440)
RBC: 4.11 MIL/uL (ref 3.80–5.20)
RDW: 14.3 % (ref 11.5–14.5)
WBC: 9.7 10*3/uL (ref 3.6–11.0)

## 2014-10-12 LAB — IRON AND TIBC
Iron: 67 ug/dL (ref 28–170)
Saturation Ratios: 21 % (ref 10.4–31.8)
TIBC: 314 ug/dL (ref 250–450)
UIBC: 247 ug/dL

## 2014-10-12 LAB — RETICULOCYTES
RBC.: 4.11 MIL/uL (ref 3.80–5.20)
Retic Count, Absolute: 49.3 10*3/uL (ref 19.0–183.0)
Retic Ct Pct: 1.2 % (ref 0.4–3.1)

## 2014-10-12 LAB — DAT, POLYSPECIFIC AHG (ARMC ONLY): Polyspecific AHG test: NEGATIVE

## 2014-10-12 LAB — FERRITIN: Ferritin: 396 ng/mL — ABNORMAL HIGH (ref 11–307)

## 2014-10-12 LAB — FOLATE: Folate: 15.8 ng/mL (ref >5.9)

## 2014-10-12 LAB — TSH: TSH: 2.677 u[IU]/mL (ref 0.350–4.500)

## 2014-10-12 LAB — VITAMIN B12: Vitamin B-12: 456 pg/mL (ref 180–914)

## 2014-10-13 LAB — PROTEIN ELECTROPHORESIS, SERUM
A/G Ratio: 0.7 (ref 0.7–2.0)
Albumin ELP: 3.4 g/dL (ref 3.2–5.6)
Alpha-1-Globulin: 0.3 g/dL (ref 0.1–0.4)
Alpha-2-Globulin: 0.8 g/dL (ref 0.4–1.2)
Beta Globulin: 1.3 g/dL (ref 0.6–1.3)
Gamma Globulin: 2.8 g/dL — ABNORMAL HIGH (ref 0.5–1.6)
Globulin, Total: 5.2 g/dL — ABNORMAL HIGH (ref 2.0–4.5)
Total Protein ELP: 8.6 g/dL — ABNORMAL HIGH (ref 6.0–8.5)

## 2014-10-17 ENCOUNTER — Telehealth: Payer: Self-pay | Admitting: *Deleted

## 2014-10-18 NOTE — Telephone Encounter (Signed)
FYI

## 2014-10-19 ENCOUNTER — Ambulatory Visit: Payer: BLUE CROSS/BLUE SHIELD | Admitting: Hematology and Oncology

## 2014-10-27 ENCOUNTER — Inpatient Hospital Stay: Payer: BLUE CROSS/BLUE SHIELD | Attending: Hematology and Oncology | Admitting: Hematology and Oncology

## 2014-10-27 ENCOUNTER — Encounter: Payer: Self-pay | Admitting: Hematology and Oncology

## 2014-10-27 NOTE — Progress Notes (Signed)
[facility] day:  10/12/2014  Chief Complaint: Anne Hickman is an 64 y.o. female with anemia and was referred in consultation by Dr. Tobias Alexander.  HPI: The patient fell out of the bathtub on 08/19/2014. She states that she was "black, blue, and purple". She began taking Advil every 4 hours. She presented to the emergency room in [place] with black tarry stools. She was empirically placed on Protonix for 10 days and told to discontinue her Advil. After 3 days, she states that she was "cured" as her stools returned back to normal.  She notes that the medication caused lower extremity swelling.  She was started on Lasix.  She was scheduled to have an upper endoscopy. This study has been rescheduled to 10/23/2014.  She underwent colonoscopy at age 51 and was "fine".   She describes herself as an alcoholic drinking 3-4 glasses of white wine a day.  She has been in rehabilitation. She states that she eats well. She denies pica.  She has had no further melena. She denies any hematochezia, epistaxis, hematuria or vaginal bleeding.  Labs from 09/21/2014 revealed a hematocrit 33.1, hemoglobin 11, MCV 104.4, platelets 103,000, white count 13,200 with an Rodessa of 8610. Comprehensive metabolic panel included an alkaline phosphatase of 162 (34-104), protein was 7.1 (normal) with an albumin of 2.9 (low), bilirubin was 2.2. Creatinine was 0.4 with a calcium of 8.7 (corrected 9.58).   Iron studies included a saturation of 32% and TIBC of 172.6 (low).  Symptomatically, she feels "tired a lot". She notes "no uumph" since January or February 2016.  She denies any weight loss or other systemic symptoms.  Past Medical History  Diagnosis Date  . Insomnia   . Generalized anxiety disorder   . Other and unspecified hyperlipidemia   . Unspecified hypothyroidism   . Alcohol abuse, episodic   . Eczema herpeticum     Past Surgical History  Procedure Laterality Date  . Appendectomy   1978  . Abdominal hysterectomy      Partial     Family History  Problem Relation Age of Onset  . Coronary artery disease Father   . Heart disease Father   . Breast cancer Maternal Aunt 60  . Mental illness Mother     Social History:  reports that she has never smoked. She has never used smokeless tobacco. She reports that she drinks about 12.6 oz of alcohol per week. She reports that she does not use illicit drugs.  The patient is alone today.  Allergies:  Allergies  Allergen Reactions  . Bee Venom Anaphylaxis  . Advil [Ibuprofen] Other (See Comments)    Bothers stomach, possibly caused internal bleeding in stomach   . Aleve [Naproxen Sodium] Nausea Only  . Betadine [Povidone Iodine]   . Codeine     GI distress  . Povidone-Iodine Other (See Comments)    burns    Current Medications: Current Outpatient Prescriptions  Medication Sig Dispense Refill  . ALPRAZolam (ALPRAZOLAM XR) 1 MG 24 hr tablet TAKE 1 TABLET BY MOUTH IN THE MORNING (Patient taking differently: Take 1 mg by mouth daily as needed for anxiety. ) 30 tablet 1  . furosemide (LASIX) 40 MG tablet Take 40 mg by mouth daily.    Marland Kitchen levothyroxine (SYNTHROID, LEVOTHROID) 50 MCG tablet TAKE ONE TABLET BY MOUTH ONE TIME DAILY  (Patient taking differently: TAKE ONE TABLET BY MOUTH ONE TIME DAILY) 30 tablet 6  . ranitidine (ZANTAC) 150 MG tablet  Take 150 mg by mouth daily.    Marland Kitchen spironolactone (ALDACTONE) 50 MG tablet Take 50 mg by mouth daily.    . benzonatate (TESSALON) 200 MG capsule Take 1 capsule (200 mg total) by mouth 3 (three) times daily as needed for cough. (Patient not taking: Reported on 10/12/2014) 60 capsule 1  . chlordiazePOXIDE (LIBRIUM) 25 MG capsule Take 1 capsule by mouth 4 times daily for 1 day. Then 1 capsule 3 times daily for 1 day. Then 1 capsule twice daily for 1 day. Then 1 capsul (Patient not taking: Reported on 10/12/2014) 11 capsule 0  . cloNIDine (CATAPRES) 0.1 MG tablet Take 1 tablet (0.1 mg total) by  mouth 2 (two) times daily. (Patient not taking: Reported on 10/12/2014) 60 tablet 0  . doxycycline (VIBRA-TABS) 100 MG tablet Take 1 tablet (100 mg total) by mouth 2 (two) times daily. (Patient not taking: Reported on 10/12/2014) 10 tablet 0  . Flunisolide HFA 80 MCG/ACT AERS Inhale 1 puff into the lungs 2 (two) times daily. (Patient not taking: Reported on 10/12/2014) 5.1 g 11  . levocetirizine (XYZAL) 5 MG tablet Take 1 tablet (5 mg total) by mouth every evening. (Patient not taking: Reported on 10/12/2014) 30 tablet 8  . Magnesium Oxide 400 MG CAPS Take 1 capsule (400 mg total) by mouth 2 (two) times daily. (Patient not taking: Reported on 10/12/2014) 30 capsule 1  . Multiple Vitamin (MULTIVITAMIN WITH MINERALS) TABS tablet Take 1 tablet by mouth daily.    . traZODone (DESYREL) 100 MG tablet TAKE 1 TABLET (100 MG TOTAL) BY MOUTH AT BEDTIME. (Patient not taking: Reported on 10/12/2014) 30 tablet 3   No current facility-administered medications for this visit.    Review of Systems:  GENERAL:  Feels tired.  Active.  No fevers, sweats or weight loss. PERFORMANCE STATUS (ECOG):  1 HEENT:  No visual changes, runny nose, sore throat, mouth sores or tenderness. Lungs: No shortness of breath or cough.  No hemoptysis. Cardiac:  No chest pain, palpitations, orthopnea, or PND. GI:  Melena resolved.  No nausea, vomiting, diarrhea, constipation, or hematochezia. GU:  No urgency, frequency, dysuria, or hematuria. Musculoskeletal:  No back pain.  No joint pain.  No muscle tenderness. Extremities:  No pain or swelling. Skin:  No rashes or skin changes. Neuro:  No headache, numbness or weakness, balance or coordination issues. Endocrine:  No diabetes, thyroid issues, hot flashes or night sweats. Psych:  No mood changes, depression or anxiety. Pain:  No focal pain. Review of systems:  All other systems reviewed and found to be negative.   Physical Exam: Blood pressure 142/91, pulse 72, temperature 97.9 F  (36.6 C), temperature source Oral, resp. rate 18, height 5\' 5"  (1.651 m), weight 144 lb 1.1 oz (65.35 kg).  GENERAL:  Well developed, well nourished, sitting comfortably in the exam room in no acute distress. MENTAL STATUS:  Alert and oriented to person, place and time. HEAD:  Short blonde hair.  Normocephalic, atraumatic, face symmetric, no Cushingoid features. EYES:  Blue eyes.  Pupils equal round and reactive to light and accomodation.  No conjunctivitis or scleral icterus. ENT:  Oropharynx clear without lesion.  Tongue normal. Mucous membranes moist.  RESPIRATORY:  Clear to auscultation without rales, wheezes or rhonchi. CARDIOVASCULAR:  Regular rate and rhythm without murmur, rub or gallop. ABDOMEN:  Soft, non-tender, with active bowel sounds, and no hepatosplenomegaly.  No masses. SKIN:  No rashes, ulcers or lesions. EXTREMITIES:  Chronic lower extremity changes.  No palpable  cords. LYMPH NODES: No palpable cervical, supraclavicular, axillary or inguinal adenopathy  NEUROLOGICAL: Slight head tremor.  Unremarkable. PSYCH:  Appropriate.   Appointment on 10/12/2014  Component Date Value Ref Range Status  . WBC 10/12/2014 9.7  3.6 - 11.0 K/uL Final  . RBC 10/12/2014 4.11  3.80 - 5.20 MIL/uL Final  . Hemoglobin 10/12/2014 13.3  12.0 - 16.0 g/dL Final  . HCT 10/12/2014 39.7  35.0 - 47.0 % Final  . MCV 10/12/2014 96.7  80.0 - 100.0 fL Final  . MCH 10/12/2014 32.3  26.0 - 34.0 pg Final  . MCHC 10/12/2014 33.4  32.0 - 36.0 g/dL Final  . RDW 10/12/2014 14.3  11.5 - 14.5 % Final  . Platelets 10/12/2014 436  150 - 440 K/uL Final  . Neutrophils Relative % 10/12/2014 59   Final  . Neutro Abs 10/12/2014 5.6  1.4 - 6.5 K/uL Final  . Lymphocytes Relative 10/12/2014 34   Final  . Lymphs Abs 10/12/2014 3.3  1.0 - 3.6 K/uL Final  . Monocytes Relative 10/12/2014 6   Final  . Monocytes Absolute 10/12/2014 0.6  0.2 - 0.9 K/uL Final  . Eosinophils Relative 10/12/2014 0   Final  . Eosinophils Absolute  10/12/2014 0.0  0 - 0.7 K/uL Final  . Basophils Relative 10/12/2014 1   Final  . Basophils Absolute 10/12/2014 0.1  0 - 0.1 K/uL Final  . Retic Ct Pct 10/12/2014 1.2  0.4 - 3.1 % Final  . RBC. 10/12/2014 4.11  3.80 - 5.20 MIL/uL Final  . Retic Count, Manual 10/12/2014 49.3  19.0 - 183.0 K/uL Final  . Vitamin B-12 10/12/2014 456  180 - 914 pg/mL Final   Comment: (NOTE) This assay is not validated for testing neonatal or myeloproliferative syndrome specimens for Vitamin B12 levels. Performed at [facility]   . Folate 10/12/2014 15.8  >5.9 ng/mL Final  . TSH 10/12/2014 2.677  0.350 - 4.500 uIU/mL Final  . Ferritin 10/12/2014 396* 11 - 307 ng/mL Final  . Iron 10/12/2014 67  28 - 170 ug/dL Final  . TIBC 10/12/2014 314  250 - 450 ug/dL Final  . Saturation Ratios 10/12/2014 21  10.4 - 31.8 % Final  . UIBC 10/12/2014 247   Final  . Total Protein ELP 10/12/2014 8.6* 6.0 - 8.5 g/dL Final  . Albumin ELP 10/12/2014 3.4  3.2 - 5.6 g/dL Final  . Alpha-1-Globulin 10/12/2014 0.3  0.1 - 0.4 g/dL Final  . Alpha-2-Globulin 10/12/2014 0.8  0.4 - 1.2 g/dL Final  . Beta Globulin 10/12/2014 1.3  0.6 - 1.3 g/dL Final  . Gamma Globulin 10/12/2014 2.8* 0.5 - 1.6 g/dL Final  . M-Spike, % 10/12/2014 Not Observed  Not Observed g/dL Final  . SPE Interp. 10/12/2014 Comment   Final   Comment: (NOTE) The SPE pattern reflects a polyclonal increase in gamma globulin due to numerous clones of plasma cells producing heterogeneous antibody in response to some form of antigenic stimulus.  Hypergammaglob- ulinemia is found in a wide variety of infectious, non-infectious, and autoimmune disease states. Evidence of monoclonal protein is not apparent. Performed At: [facility] [place], [place] [id number] Lindon Romp MD KG:[id number]   . Comment 10/12/2014 Comment   Final   Comment: (NOTE) Protein electrophoresis scan will follow via computer, mail, or courier delivery.   Marland Kitchen  GLOBULIN, TOTAL 10/12/2014 5.2* 2.0 - 4.5 g/dL Corrected  . A/G Ratio 10/12/2014 0.7  0.7 - 2.0 Corrected  . Polyspecific AHG test  10/12/2014 NEG   Final    Assessment:  Anne Hickman is an 64 y.o. female with anemia and a history of melena associated with excess ibuprofen intake following a fall.  Diet is good.  Colonoscopy in 2004 was normal by patient's report.  Since treatment with Protonix and discontinuation of ibuprofen, her melena has resolved.  She denies any epistaxis, hemoptysis, hematochezia, hematuria or vaginal bleeding.  Available labs from 05/052016 revealed a macrocytic anemia with reactive thrombocytosis.  Albumin was low (2.9) with a normal protein (7.1). Liver function tests included a bilirubin of 2.2 and an alkaline phosphatase of 162 (elevated).  She has a history of daily alcohol use.  Symptomatically, she notes fatigue sine 05/2014.  Exam is unremarkable.  Plan: 1. Review diagnosis of anemia. Outside labs reveal macrocytic red cell indices indicating a possible deficiency (B-12 or folate) or hypothyroidism. I also discussed liver disease based on her alcohol intake. She has reactive thrombocytosis. Liver function tests are mildly elevated. Given her normal serum protein and low albumin, I discussed a serum protein electrophoresis. I encouraged her to follow-up with gastroenterology for an upper endoscopy. We discussed the issues with excess ibuprofen intake. 2. Labs today:  CBC with diff, ferritin, iron studies, retic, B12, folate, TSH, SPEP. 3. RTC to discuss results.  Lequita Asal, MD  10/12/2014, 4:51 PM

## 2014-11-07 ENCOUNTER — Ambulatory Visit: Payer: BLUE CROSS/BLUE SHIELD | Admitting: Hematology and Oncology

## 2014-11-10 ENCOUNTER — Ambulatory Visit: Payer: BLUE CROSS/BLUE SHIELD | Admitting: Hematology and Oncology

## 2014-11-17 ENCOUNTER — Inpatient Hospital Stay: Payer: BLUE CROSS/BLUE SHIELD | Attending: Hematology and Oncology | Admitting: Hematology and Oncology

## 2014-11-17 ENCOUNTER — Inpatient Hospital Stay: Payer: BLUE CROSS/BLUE SHIELD | Admitting: Hematology and Oncology

## 2014-11-17 ENCOUNTER — Telehealth: Payer: Self-pay | Admitting: Hematology and Oncology

## 2014-11-17 ENCOUNTER — Ambulatory Visit: Payer: BLUE CROSS/BLUE SHIELD

## 2014-11-17 ENCOUNTER — Encounter: Payer: Self-pay | Admitting: Hematology and Oncology

## 2014-11-17 VITALS — BP 135/87 | HR 71 | Temp 98.0°F | Wt 143.1 lb

## 2014-11-17 DIAGNOSIS — D649 Anemia, unspecified: Secondary | ICD-10-CM | POA: Insufficient documentation

## 2014-11-17 DIAGNOSIS — F1010 Alcohol abuse, uncomplicated: Secondary | ICD-10-CM | POA: Diagnosis not present

## 2014-11-17 DIAGNOSIS — K921 Melena: Secondary | ICD-10-CM | POA: Diagnosis not present

## 2014-11-17 DIAGNOSIS — D473 Essential (hemorrhagic) thrombocythemia: Secondary | ICD-10-CM

## 2014-11-17 DIAGNOSIS — Z79899 Other long term (current) drug therapy: Secondary | ICD-10-CM | POA: Diagnosis not present

## 2014-11-17 DIAGNOSIS — E039 Hypothyroidism, unspecified: Secondary | ICD-10-CM | POA: Diagnosis not present

## 2014-11-17 DIAGNOSIS — E785 Hyperlipidemia, unspecified: Secondary | ICD-10-CM | POA: Diagnosis not present

## 2014-11-17 DIAGNOSIS — D890 Polyclonal hypergammaglobulinemia: Secondary | ICD-10-CM

## 2014-11-17 LAB — CBC WITH DIFFERENTIAL/PLATELET
Basophils Absolute: 0.1 10*3/uL (ref 0–0.1)
Basophils Relative: 2 %
Eosinophils Absolute: 0 10*3/uL (ref 0–0.7)
Eosinophils Relative: 0 %
HCT: 40.6 % (ref 35.0–47.0)
Hemoglobin: 13.4 g/dL (ref 12.0–16.0)
Lymphocytes Relative: 37 %
Lymphs Abs: 2.6 10*3/uL (ref 1.0–3.6)
MCH: 31.5 pg (ref 26.0–34.0)
MCHC: 33.1 g/dL (ref 32.0–36.0)
MCV: 95.1 fL (ref 80.0–100.0)
Monocytes Absolute: 0.8 10*3/uL (ref 0.2–0.9)
Monocytes Relative: 11 %
Neutro Abs: 3.6 10*3/uL (ref 1.4–6.5)
Neutrophils Relative %: 50 %
Platelets: 152 10*3/uL (ref 150–440)
RBC: 4.27 MIL/uL (ref 3.80–5.20)
RDW: 15.6 % — ABNORMAL HIGH (ref 11.5–14.5)
WBC: 7.1 10*3/uL (ref 3.6–11.0)

## 2014-11-17 LAB — COMPREHENSIVE METABOLIC PANEL
ALT: 46 U/L (ref 14–54)
AST: 197 U/L — ABNORMAL HIGH (ref 15–41)
Albumin: 4 g/dL (ref 3.5–5.0)
Alkaline Phosphatase: 219 U/L — ABNORMAL HIGH (ref 38–126)
Anion gap: 16 — ABNORMAL HIGH (ref 5–15)
BUN: 9 mg/dL (ref 6–20)
CO2: 23 mmol/L (ref 22–32)
Calcium: 8.4 mg/dL — ABNORMAL LOW (ref 8.9–10.3)
Chloride: 97 mmol/L — ABNORMAL LOW (ref 101–111)
Creatinine, Ser: 0.42 mg/dL — ABNORMAL LOW (ref 0.44–1.00)
GFR calc Af Amer: >60 mL/min (ref >60)
GFR calc non Af Amer: >60 mL/min (ref >60)
Glucose, Bld: 82 mg/dL (ref 65–99)
Potassium: 4.6 mmol/L (ref 3.5–5.1)
Sodium: 136 mmol/L (ref 135–145)
Total Bilirubin: 2.5 mg/dL — ABNORMAL HIGH (ref 0.3–1.2)
Total Protein: 8.6 g/dL — ABNORMAL HIGH (ref 6.5–8.1)

## 2014-11-17 NOTE — Progress Notes (Signed)
[facility] day:  11/17/2014  Chief Complaint: Anne Hickman is an 64 y.o. female with anemia who is seen for review of work-up and discussion regarding direction of therapy.  HPI: The patient was last seen in the medical oncology clinic on 10/12/2014.  At that time, she was seen for initial consultation.  She had a history of melena associated with excess ibuprofen intake following a fall. Diet was good. Colonoscopy in 2004 was normal by patient's report. Since treatment with Protonix and discontinuation of ibuprofen, her melena had resolved. She denied any epistaxis, hemoptysis, hematochezia, hematuria or vaginal bleeding.  Outside labs from 09/21/2014 revealed a macrocytic anemia with reactive thrombocytosis. Albumin was low (2.9) with a normal protein (7.1). Liver function tests included a bilirubin of 2.2 and an alkaline phosphatase of 162 (elevated). She had a history of daily alcohol use.  She was fatigued.  Exam was unrremarkable.  Work-up on 10/12/2014 revealed a CBC with hematocrit 39.7, hemoglobin 13.3, MCV 96.7, platelets 436,000, white count 9700 with an [place] of 5600. Differential was unremarkable.  Reticulocyte count was 1.2%.  Folate was 15.8.  B12 was 456.  TSH was 2.677.  Ferritin was 396.  Iron studies included a saturation of 21% and TIBC of 314Serum protein electrophoresis revealed no monoclonal spike.  Coombs was negative.  She denies any new complaints.  Past Medical History  Diagnosis Date  . Insomnia   . Generalized anxiety disorder   . Other and unspecified hyperlipidemia   . Unspecified hypothyroidism   . Alcohol abuse, episodic   . Eczema herpeticum     Past Surgical History  Procedure Laterality Date  . Appendectomy  1978  . Abdominal hysterectomy      Partial   . Esophagogastroduodenoscopy (egd) with propofol N/A 12/13/2014    Procedure: ESOPHAGOGASTRODUODENOSCOPY (EGD) WITH PROPOFOL;  Surgeon: Manya Silvas, MD;  Location: [facility];  Service: Endoscopy;  Laterality: N/A;    Family History  Problem Relation Age of Onset  . Coronary artery disease Father   . Heart disease Father   . Breast cancer Maternal Aunt 60  . Mental illness Mother     Social History:  reports that she has never smoked. She has never used smokeless tobacco. She reports that she does not drink alcohol or use illicit drugs.  The patient is alone today.  Allergies:  Allergies  Allergen Reactions  . Bee Venom Anaphylaxis  . Advil [Ibuprofen] Nausea And Vomiting  . Aleve [Naproxen Sodium] Nausea And Vomiting  . Betadine [Povidone Iodine] Other (See Comments)    Reaction:  Burning   . Codeine Other (See Comments)    Reaction:  GI upset     Current Medications: Current Outpatient Prescriptions  Medication Sig Dispense Refill  . amoxicillin-clavulanate (AUGMENTIN) 500-125 MG tablet Take 1 tablet (500 mg total) by mouth 2 (two) times daily. 4 tablet 0  . bisacodyl (DULCOLAX) 10 MG suppository Place 1 suppository (10 mg total) rectally daily as needed for moderate constipation. 6 suppository 0  . furosemide (LASIX) 80 MG tablet Take 1 tablet (80 mg total) by mouth daily. 15 tablet 0  . lactulose (CHRONULAC) 10 GM/15ML solution Take 45 mLs (30 g total) by mouth every 6 (six) hours. 240 mL 0  . levothyroxine (SYNTHROID, LEVOTHROID) 75 MCG tablet Take 75 mcg by mouth daily before breakfast.    . LORazepam (ATIVAN) 0.5 MG tablet Take 1 tablet (0.5 mg total) by mouth every 6 (  six) hours as needed for anxiety. 15 tablet 0  . midodrine (PROAMATINE) 10 MG tablet Take 1 tablet (10 mg total) by mouth 2 (two) times daily with a meal. 60 tablet 0  . oxyCODONE (OXY IR/ROXICODONE) 5 MG immediate release tablet Take 1 tablet (5 mg total) by mouth every 4 (four) hours as needed (mild/mod pain). 30 tablet 0  . pantoprazole (PROTONIX) 40 MG tablet Take 40 mg by mouth daily.    Marland Kitchen spironolactone (ALDACTONE) 50 MG tablet Take 50  mg by mouth daily.     No current facility-administered medications for this visit.    Review of Systems:  GENERAL:  Feels tired.  No fevers, sweats or weight loss. PERFORMANCE STATUS (ECOG):  1 HEENT:  No visual changes, runny nose, sore throat, mouth sores or tenderness. Lungs: No shortness of breath or cough.  No hemoptysis. Cardiac:  No chest pain, palpitations, orthopnea, or PND. GI:  Poor appetite.  No nausea, vomiting, diarrhea, constipation, or melena, hematochezia. GU:  No urgency, frequency, dysuria, or hematuria. Musculoskeletal:  No back pain.  No joint pain.  No muscle tenderness. Extremities:  No pain or swelling. Skin:  No rashes or skin changes. Neuro:  No headache, numbness or weakness, balance or coordination issues. Endocrine:  No diabetes.  Thyroid disease on Synthroid.  No hot flashes or night sweats. Psych:  No mood changes, depression or anxiety. Pain:  No focal pain. Review of systems:  All other systems reviewed and found to be negative.   Physical Exam: Blood pressure 135/87, pulse 71, temperature 98 F (36.7 C), temperature source Oral, weight 143 lb 1.3 oz (64.9 kg), SpO2 97 %.  GENERAL:  Well developed, well nourished, sitting comfortably in the exam room in no acute distress. MENTAL STATUS:  Alert and oriented to person, place and time. HEAD:  Short blonde hair.  Normocephalic, atraumatic, face symmetric, no Cushingoid features. EYES:  Blue eyes.  Pupils equal round and reactive to light and accomodation.  No conjunctivitis or scleral icterus. ENT:  Oropharynx clear without lesion.  Tongue normal. Mucous membranes moist.  RESPIRATORY:  Clear to auscultation without rales, wheezes or rhonchi. CARDIOVASCULAR:  Regular rate and rhythm without murmur, rub or gallop. ABDOMEN:  Soft, non-tender, with active bowel sounds, and no hepatosplenomegaly.  No masses. SKIN:  No rashes, ulcers or lesions. EXTREMITIES:  Chronic lower extremity changes.  No palpable  cords. LYMPH NODES: No palpable cervical, supraclavicular, axillary or inguinal adenopathy  NEUROLOGICAL: Slight head tremor.  Unremarkable. PSYCH:  Appropriate.   Clinical Support on 11/17/2014  Component Date Value Ref Range Status  . Anit Nuclear Antibody(ANA) 11/17/2014 Positive* Negative Final   Comment: (NOTE) Performed At: [facility] [place], [place] [id number] Lindon Romp MD QQ:[id number]   . WBC 11/17/2014 7.1  3.6 - 11.0 K/uL Final  . RBC 11/17/2014 4.27  3.80 - 5.20 MIL/uL Final  . Hemoglobin 11/17/2014 13.4  12.0 - 16.0 g/dL Final  . HCT 11/17/2014 40.6  35.0 - 47.0 % Final  . MCV 11/17/2014 95.1  80.0 - 100.0 fL Final  . MCH 11/17/2014 31.5  26.0 - 34.0 pg Final  . MCHC 11/17/2014 33.1  32.0 - 36.0 g/dL Final  . RDW 11/17/2014 15.6* 11.5 - 14.5 % Final  . Platelets 11/17/2014 152  150 - 440 K/uL Final  . Neutrophils Relative % 11/17/2014 50   Final  . Neutro Abs 11/17/2014 3.6  1.4 - 6.5 K/uL Final  . Lymphocytes Relative 11/17/2014  37   Final  . Lymphs Abs 11/17/2014 2.6  1.0 - 3.6 K/uL Final  . Monocytes Relative 11/17/2014 11   Final  . Monocytes Absolute 11/17/2014 0.8  0.2 - 0.9 K/uL Final  . Eosinophils Relative 11/17/2014 0   Final  . Eosinophils Absolute 11/17/2014 0.0  0 - 0.7 K/uL Final  . Basophils Relative 11/17/2014 2   Final  . Basophils Absolute 11/17/2014 0.1  0 - 0.1 K/uL Final  . Sodium 11/17/2014 136  135 - 145 mmol/L Final  . Potassium 11/17/2014 4.6  3.5 - 5.1 mmol/L Final   HEMOLYSIS AT THIS LEVEL MAY AFFECT RESULT  . Chloride 11/17/2014 97* 101 - 111 mmol/L Final  . CO2 11/17/2014 23  22 - 32 mmol/L Final  . Glucose, Bld 11/17/2014 82  65 - 99 mg/dL Final  . BUN 11/17/2014 9  6 - 20 mg/dL Final  . Creatinine, Ser 11/17/2014 0.42* 0.44 - 1.00 mg/dL Final  . Calcium 11/17/2014 8.4* 8.9 - 10.3 mg/dL Final  . Total Protein 11/17/2014 8.6* 6.5 - 8.1 g/dL Final  . Albumin 11/17/2014 4.0  3.5 - 5.0 g/dL Final  . AST  11/17/2014 197* 15 - 41 U/L Final  . ALT 11/17/2014 46  14 - 54 U/L Final  . Alkaline Phosphatase 11/17/2014 219* 38 - 126 U/L Final  . Total Bilirubin 11/17/2014 2.5* 0.3 - 1.2 mg/dL Final  . GFR calc non Af Amer 11/17/2014 >60  >60 mL/min Final  . GFR calc Af Amer 11/17/2014 >60  >60 mL/min Final   Comment: (NOTE) The eGFR has been calculated using the CKD EPI equation. This calculation has not been validated in all clinical situations. eGFR's persistently <60 mL/min signify possible Chronic Kidney Disease.   . Anion gap 11/17/2014 16* 5 - 15 Final  . ds DNA Ab 11/17/2014 3  0 - 9 IU/mL Final   Comment: (NOTE)                                   Negative      <5                                   Equivocal  5 - 9                                   Positive      >9   . Ribonucleic Protein 11/17/2014 1.5* 0.0 - 0.9 AI Final  . ENA SM Ab Ser-aCnc 11/17/2014 <0.2  0.0 - 0.9 AI Final  . Scleroderma (Scl-70) (ENA) Antibod* 11/17/2014 <0.2  0.0 - 0.9 AI Final  . SSA (Ro) (ENA) Antibody, IgG 11/17/2014 <0.2  0.0 - 0.9 AI Final  . SSB (La) (ENA) Antibody, IgG 11/17/2014 <0.2  0.0 - 0.9 AI Final  . Chromatin Ab SerPl-aCnc 11/17/2014 <0.2  0.0 - 0.9 AI Final  . Anti JO-1 11/17/2014 <0.2  0.0 - 0.9 AI Final  . Centromere Ab Screen 11/17/2014 <0.2  0.0 - 0.9 AI Final    Assessment:  Anne Hickman is an 64 y.o. female with anemia and a history of melena associated with excess ibuprofen intake following a fall.  Diet is good.  Colonoscopy in 2004 was normal by patient's report.  Since treatment  with Protonix and discontinuation of ibuprofen, her melena has resolved.  She denies any epistaxis, hemoptysis, hematochezia, hematuria or vaginal bleeding.  Available labs from 09/21/2014 revealed a macrocytic anemia with reactive thrombocytosis.  Albumin was low (2.9) with a normal protein (7.1). Liver function tests included a bilirubin of 2.2 and an alkaline phosphatase of 162 (elevated).  She has a  history of daily alcohol use.  Work-up on 10/12/2014 revealed a normal CBC.  The following labs were normal:  B12, folate, TSH, SPEP,and Coombs. She has a polyclonal gammopathy.  Ferritin was 396 with an iron saturation of 21% and TIBC of 314.  Symptomatically, she notes fatigue sine 05/2014.  Exam is unremarkable.  Plan: 1. Review work-up and resolution of anemia.  Discuss follow-up with gastroenterology for an upper endoscopy.  Avoid excess ibuprofen intake. 2. Labs today:  CBC with diff, CMP, ANA with reflex. 3. Nurse to call patient with results. 4. RTC to prn.   Lequita Asal, MD  11/17/2014, 11:48 AM

## 2014-11-17 NOTE — Telephone Encounter (Signed)
Re:  I discussed with the patient her labs today.  Discussed her concerning liver function tests.  I discussed a CT scan.  She said one was done in April (no available report).  She has a copy.  I asked her to bring a copy by.  She

## 2014-11-21 LAB — ENA+DNA/DS+ANTICH+CENTRO+JO...
Anti JO-1: <0.2 AI (ref 0.0–0.9)
Centromere Ab Screen: <0.2 AI (ref 0.0–0.9)
Chromatin Ab SerPl-aCnc: <0.2 AI (ref 0.0–0.9)
ENA SM Ab Ser-aCnc: <0.2 AI (ref 0.0–0.9)
Ribonucleic Protein: 1.5 AI — ABNORMAL HIGH (ref 0.0–0.9)
SSA (Ro) (ENA) Antibody, IgG: <0.2 AI (ref 0.0–0.9)
SSB (La) (ENA) Antibody, IgG: <0.2 AI (ref 0.0–0.9)
Scleroderma (Scl-70) (ENA) Antibody, IgG: <0.2 AI (ref 0.0–0.9)
ds DNA Ab: 3 IU/mL (ref 0–9)

## 2014-11-21 LAB — ANA W/REFLEX IF POSITIVE: Anti Nuclear Antibody(ANA): POSITIVE — AB

## 2014-12-13 ENCOUNTER — Ambulatory Visit: Payer: BLUE CROSS/BLUE SHIELD | Admitting: Anesthesiology

## 2014-12-13 ENCOUNTER — Ambulatory Visit
Admission: RE | Admit: 2014-12-13 | Discharge: 2014-12-13 | Disposition: A | Discharge status: Home / Self Care | Payer: BLUE CROSS/BLUE SHIELD | Source: Ambulatory Visit | Attending: Unknown Physician Specialty | Admitting: Unknown Physician Specialty

## 2014-12-13 ENCOUNTER — Encounter: Payer: Self-pay | Admitting: Anesthesiology

## 2014-12-13 ENCOUNTER — Encounter
Admission: RE | Disposition: A | Discharge status: Home / Self Care | Payer: Self-pay | Source: Ambulatory Visit | Attending: Unknown Physician Specialty

## 2014-12-13 DIAGNOSIS — E785 Hyperlipidemia, unspecified: Secondary | ICD-10-CM | POA: Diagnosis not present

## 2014-12-13 DIAGNOSIS — F1010 Alcohol abuse, uncomplicated: Secondary | ICD-10-CM | POA: Insufficient documentation

## 2014-12-13 DIAGNOSIS — K2970 Gastritis, unspecified, without bleeding: Secondary | ICD-10-CM | POA: Diagnosis not present

## 2014-12-13 DIAGNOSIS — I8500 Esophageal varices without bleeding: Secondary | ICD-10-CM | POA: Insufficient documentation

## 2014-12-13 DIAGNOSIS — K7030 Alcoholic cirrhosis of liver without ascites: Secondary | ICD-10-CM | POA: Insufficient documentation

## 2014-12-13 DIAGNOSIS — K759 Inflammatory liver disease, unspecified: Secondary | ICD-10-CM | POA: Insufficient documentation

## 2014-12-13 DIAGNOSIS — K921 Melena: Secondary | ICD-10-CM | POA: Insufficient documentation

## 2014-12-13 DIAGNOSIS — F411 Generalized anxiety disorder: Secondary | ICD-10-CM | POA: Insufficient documentation

## 2014-12-13 DIAGNOSIS — D649 Anemia, unspecified: Secondary | ICD-10-CM | POA: Diagnosis not present

## 2014-12-13 DIAGNOSIS — E039 Hypothyroidism, unspecified: Secondary | ICD-10-CM | POA: Insufficient documentation

## 2014-12-13 DIAGNOSIS — K7460 Unspecified cirrhosis of liver: Secondary | ICD-10-CM | POA: Diagnosis not present

## 2014-12-13 DIAGNOSIS — B000 Eczema herpeticum: Secondary | ICD-10-CM | POA: Insufficient documentation

## 2014-12-13 DIAGNOSIS — Z79899 Other long term (current) drug therapy: Secondary | ICD-10-CM | POA: Diagnosis not present

## 2014-12-13 HISTORY — PX: ESOPHAGOGASTRODUODENOSCOPY (EGD) WITH PROPOFOL: SHX5813

## 2014-12-13 LAB — BASIC METABOLIC PANEL
Anion gap: 12 (ref 5–15)
BUN: 9 mg/dL (ref 6–20)
CHLORIDE: 97 mmol/L — AB (ref 101–111)
CO2: 23 mmol/L (ref 22–32)
Calcium: 8.4 mg/dL — ABNORMAL LOW (ref 8.9–10.3)
Creatinine, Ser: 0.37 mg/dL — ABNORMAL LOW (ref 0.44–1.00)
GFR calc Af Amer: >60 mL/min (ref >60)
GFR calc non Af Amer: >60 mL/min (ref >60)
GLUCOSE: 105 mg/dL — AB (ref 65–99)
POTASSIUM: 4 mmol/L (ref 3.5–5.1)
SODIUM: 132 mmol/L — AB (ref 135–145)

## 2014-12-13 LAB — PROTIME-INR
INR: 1.45
Prothrombin Time: 17.8 seconds — ABNORMAL HIGH (ref 11.4–15.0)

## 2014-12-13 SURGERY — ESOPHAGOGASTRODUODENOSCOPY (EGD) WITH PROPOFOL
Anesthesia: General

## 2014-12-13 MED ORDER — GLYCOPYRROLATE 0.2 MG/ML IJ SOLN
INTRAMUSCULAR | Status: DC | PRN
  Administered 2014-12-13: .1 mg via INTRAVENOUS

## 2014-12-13 MED ORDER — SODIUM CHLORIDE 0.9 % IV SOLN
INTRAVENOUS | Status: DC
  Administered 2014-12-13: 11:00:00 via INTRAVENOUS

## 2014-12-13 MED ORDER — PROPOFOL INFUSION 10 MG/ML OPTIME
INTRAVENOUS | Status: DC | PRN
  Administered 2014-12-13: 140 ug/kg/min via INTRAVENOUS

## 2014-12-13 MED ORDER — MIDAZOLAM HCL 5 MG/5ML IJ SOLN
INTRAMUSCULAR | Status: DC | PRN
  Administered 2014-12-13: 1 mg via INTRAVENOUS

## 2014-12-13 MED ORDER — SODIUM CHLORIDE 0.9 % IV SOLN: INTRAVENOUS | Status: DC

## 2014-12-13 MED ORDER — BUTAMBEN-TETRACAINE-BENZOCAINE 2-2-14 % EX AERO
INHALATION_SPRAY | CUTANEOUS | Status: DC | PRN
  Administered 2014-12-13: 1 via TOPICAL

## 2014-12-13 MED ORDER — FENTANYL CITRATE (PF) 100 MCG/2ML IJ SOLN
INTRAMUSCULAR | Status: DC | PRN
  Administered 2014-12-13: 50 ug via INTRAVENOUS

## 2014-12-13 MED ORDER — PROPOFOL 10 MG/ML IV BOLUS
INTRAVENOUS | Status: DC | PRN
  Administered 2014-12-13: 50 mg via INTRAVENOUS

## 2014-12-13 NOTE — H&P (Signed)
Primary Care Physician:  Glendon Axe, MD Primary Gastroenterologist:  Dr. Vira Agar  Pre-Procedure History & Physical: HPI:  Anne Hickman is a 64 y.o. female is here for an endoscopy.   Past Medical History  Diagnosis Date  . Insomnia   . Generalized anxiety disorder   . Other and unspecified hyperlipidemia   . Unspecified hypothyroidism   . Alcohol abuse, episodic   . Eczema herpeticum     Past Surgical History  Procedure Laterality Date  . Appendectomy  1978  . Abdominal hysterectomy      Partial     Prior to Admission medications   Medication Sig Start Date End Date Taking? Authorizing Provider  ALPRAZolam (ALPRAZOLAM XR) 1 MG 24 hr tablet TAKE 1 TABLET BY MOUTH IN THE MORNING Patient taking differently: TAKE 1 TABLET BY MOUTH IN THE MORNING 03/15/14  Yes Crecencio Mc, MD  furosemide (LASIX) 40 MG tablet Take 40 mg by mouth daily.   Yes Historical Provider, MD  levocetirizine (XYZAL) 5 MG tablet Take 1 tablet (5 mg total) by mouth every evening. Patient not taking: Reported on 10/12/2014 04/18/14  Yes Crecencio Mc, MD  Magnesium Oxide 400 MG CAPS Take 1 capsule (400 mg total) by mouth 2 (two) times daily. Patient not taking: Reported on 10/12/2014 08/10/13  Yes Crecencio Mc, MD  Multiple Vitamin (MULTIVITAMIN WITH MINERALS) TABS tablet Take 1 tablet by mouth daily.   Yes Historical Provider, MD  ranitidine (ZANTAC) 150 MG tablet Take 150 mg by mouth daily.   Yes Historical Provider, MD  spironolactone (ALDACTONE) 50 MG tablet Take 50 mg by mouth daily.   Yes Historical Provider, MD  benzonatate (TESSALON) 200 MG capsule Take 1 capsule (200 mg total) by mouth 3 (three) times daily as needed for cough. Patient not taking: Reported on 11/17/2014 08/04/13   Crecencio Mc, MD  chlordiazePOXIDE (LIBRIUM) 25 MG capsule Take 1 capsule by mouth 4 times daily for 1 day. Then 1 capsule 3 times daily for 1 day. Then 1 capsule twice daily for 1 day. Then 1 capsul Patient not  taking: Reported on 11/17/2014 07/27/14   Crecencio Mc, MD  cloNIDine (CATAPRES) 0.1 MG tablet Take 1 tablet (0.1 mg total) by mouth 2 (two) times daily. Patient not taking: Reported on 11/17/2014 02/28/14   Crecencio Mc, MD  doxycycline (VIBRA-TABS) 100 MG tablet Take 1 tablet (100 mg total) by mouth 2 (two) times daily. Patient not taking: Reported on 10/12/2014 07/06/14   Rubbie Battiest, NP  Flunisolide HFA 80 MCG/ACT AERS Inhale 1 puff into the lungs 2 (two) times daily. Patient not taking: Reported on 11/17/2014 03/15/14   Crecencio Mc, MD  levothyroxine (SYNTHROID, LEVOTHROID) 75 MCG tablet  10/12/14   Historical Provider, MD  traZODone (DESYREL) 100 MG tablet TAKE 1 TABLET (100 MG TOTAL) BY MOUTH AT BEDTIME. Patient not taking: Reported on 10/12/2014 02/28/14   Crecencio Mc, MD    Allergies as of 09/22/2014 - Review Complete 07/06/2014  Allergen Reaction Noted  . Betadine [povidone iodine]  06/17/2011  . Codeine  02/11/2012    Family History  Problem Relation Age of Onset  . Coronary artery disease Father   . Heart disease Father   . Breast cancer Maternal Aunt 60  . Mental illness Mother     History   Social History  . Marital Status: Married    Spouse Name: N/A  . Number of Children: 4  . Years of Education:  N/A   Occupational History  . Full time    Social History Main Topics  . Smoking status: Never Smoker   . Smokeless tobacco: Never Used  . Alcohol Use: 12.6 oz/week    21 Glasses of wine per week     Comment: 1- 2 bottles of wine a day  . Drug Use: No  . Sexual Activity: Not on file   Other Topics Concern  . Not on file   Social History Narrative   Lives with spouse.    Review of Systems: See HPI, otherwise negative ROS  Physical Exam: BP 134/78 mmHg  Pulse 77  Temp(Src) 98.5 F (36.9 C) (Tympanic)  Resp 19  Ht 5\' 5"  (1.651 m)  Wt 63.05 kg (139 lb)  BMI 23.13 kg/m2  SpO2 100% General:   Alert,  pleasant and cooperative in NAD Head:   Normocephalic and atraumatic. Neck:  Supple; no masses or thyromegaly. Lungs:  Clear throughout to auscultation.    Heart:  Regular rate and rhythm. Abdomen:  Soft, nontender and nondistended. Normal bowel sounds, without guarding, and without rebound.   Neurologic:  Alert and  oriented x4;  grossly normal neurologically.  Impression/Plan: Anne Hickman is here for an endoscopy to be performed for melena, anemia, evaluate for cirrhosis caused varices  Risks, benefits, limitations, and alternatives regarding  endoscopy have been reviewed with the patient.  Questions have been answered.  All parties agreeable.   Gaylyn Cheers, MD  12/13/2014, 11:40 AM   Primary Care Physician:  Glendon Axe, MD Primary Gastroenterologist:  Dr. Vira Agar  Pre-Procedure History & Physical: HPI:  Anne Hickman is a 64 y.o. female is here for an endoscopy.   Past Medical History  Diagnosis Date  . Insomnia   . Generalized anxiety disorder   . Other and unspecified hyperlipidemia   . Unspecified hypothyroidism   . Alcohol abuse, episodic   . Eczema herpeticum     Past Surgical History  Procedure Laterality Date  . Appendectomy  1978  . Abdominal hysterectomy      Partial     Prior to Admission medications   Medication Sig Start Date End Date Taking? Authorizing Provider  ALPRAZolam (ALPRAZOLAM XR) 1 MG 24 hr tablet TAKE 1 TABLET BY MOUTH IN THE MORNING Patient taking differently: TAKE 1 TABLET BY MOUTH IN THE MORNING 03/15/14  Yes Crecencio Mc, MD  furosemide (LASIX) 40 MG tablet Take 40 mg by mouth daily.   Yes Historical Provider, MD  levocetirizine (XYZAL) 5 MG tablet Take 1 tablet (5 mg total) by mouth every evening. Patient not taking: Reported on 10/12/2014 04/18/14  Yes Crecencio Mc, MD  Magnesium Oxide 400 MG CAPS Take 1 capsule (400 mg total) by mouth 2 (two) times daily. Patient not taking: Reported on 10/12/2014 08/10/13  Yes Crecencio Mc, MD  Multiple Vitamin (MULTIVITAMIN  WITH MINERALS) TABS tablet Take 1 tablet by mouth daily.   Yes Historical Provider, MD  ranitidine (ZANTAC) 150 MG tablet Take 150 mg by mouth daily.   Yes Historical Provider, MD  spironolactone (ALDACTONE) 50 MG tablet Take 50 mg by mouth daily.   Yes Historical Provider, MD  benzonatate (TESSALON) 200 MG capsule Take 1 capsule (200 mg total) by mouth 3 (three) times daily as needed for cough. Patient not taking: Reported on 11/17/2014 08/04/13   Crecencio Mc, MD  chlordiazePOXIDE (LIBRIUM) 25 MG capsule Take 1 capsule by mouth 4 times daily for 1 day. Then 1 capsule 3  times daily for 1 day. Then 1 capsule twice daily for 1 day. Then 1 capsul Patient not taking: Reported on 11/17/2014 07/27/14   Crecencio Mc, MD  cloNIDine (CATAPRES) 0.1 MG tablet Take 1 tablet (0.1 mg total) by mouth 2 (two) times daily. Patient not taking: Reported on 11/17/2014 02/28/14   Crecencio Mc, MD  doxycycline (VIBRA-TABS) 100 MG tablet Take 1 tablet (100 mg total) by mouth 2 (two) times daily. Patient not taking: Reported on 10/12/2014 07/06/14   Rubbie Battiest, NP  Flunisolide HFA 80 MCG/ACT AERS Inhale 1 puff into the lungs 2 (two) times daily. Patient not taking: Reported on 11/17/2014 03/15/14   Crecencio Mc, MD  levothyroxine (SYNTHROID, LEVOTHROID) 75 MCG tablet  10/12/14   Historical Provider, MD  traZODone (DESYREL) 100 MG tablet TAKE 1 TABLET (100 MG TOTAL) BY MOUTH AT BEDTIME. Patient not taking: Reported on 10/12/2014 02/28/14   Crecencio Mc, MD    Allergies as of 09/22/2014 - Review Complete 07/06/2014  Allergen Reaction Noted  . Betadine [povidone iodine]  06/17/2011  . Codeine  02/11/2012    Family History  Problem Relation Age of Onset  . Coronary artery disease Father   . Heart disease Father   . Breast cancer Maternal Aunt 60  . Mental illness Mother     History   Social History  . Marital Status: Married    Spouse Name: N/A  . Number of Children: 4  . Years of Education: N/A    Occupational History  . Full time    Social History Main Topics  . Smoking status: Never Smoker   . Smokeless tobacco: Never Used  . Alcohol Use: 12.6 oz/week    21 Glasses of wine per week     Comment: 1- 2 bottles of wine a day  . Drug Use: No  . Sexual Activity: Not on file   Other Topics Concern  . Not on file   Social History Narrative   Lives with spouse.    Review of Systems: See HPI, otherwise negative ROS  Physical Exam: BP 134/78 mmHg  Pulse 77  Temp(Src) 98.5 F (36.9 C) (Tympanic)  Resp 19  Ht 5\' 5"  (1.651 m)  Wt 63.05 kg (139 lb)  BMI 23.13 kg/m2  SpO2 100% General:   Alert,  pleasant and cooperative in NAD Head:  Normocephalic and atraumatic. Neck:  Supple; no masses or thyromegaly. Lungs:  Clear throughout to auscultation.    Heart:  Regular rate and rhythm. Abdomen:  Soft, nontender and nondistended. Normal bowel sounds, without guarding, and without rebound.   Neurologic:  Alert and  oriented x4;  grossly normal neurologically. Skin with spider angiomas Impression/Plan: Anne Hickman is here for an endoscopy to be performed for anemia, melena, R/O cirrhosis induced varices  Risks, benefits, limitations, and alternatives regarding  endoscopy have been reviewed with the patient.  Questions have been answered.  All parties agreeable.   Gaylyn Cheers, MD  12/13/2014, 11:40 AM

## 2014-12-13 NOTE — Anesthesia Preprocedure Evaluation (Signed)
Anesthesia Evaluation  Patient identified by MRN, date of birth, ID band  Reviewed: Allergy & Precautions, NPO status , Patient's Chart, lab work & pertinent test results  Airway Mallampati: III       Dental  (+) Caps   Pulmonary neg pulmonary ROS,    Pulmonary exam normal       Cardiovascular Normal cardiovascular exam    Neuro/Psych PSYCHIATRIC DISORDERS Anxiety negative neurological ROS     GI/Hepatic (+) Cirrhosis -      , Hepatitis -, Unspecifiedmelena   Endo/Other  Hypothyroidism   Renal/GU   negative genitourinary   Musculoskeletal negative musculoskeletal ROS (+)   Abdominal Normal abdominal exam  (+)   Peds negative pediatric ROS (+)  Hematology  (+) anemia ,   Anesthesia Other Findings   Reproductive/Obstetrics negative OB ROS                             Anesthesia Physical Anesthesia Plan  ASA: III  Anesthesia Plan: General   Post-op Pain Management:    Induction: Intravenous  Airway Management Planned: Nasal Cannula  Additional Equipment:   Intra-op Plan:   Post-operative Plan:   Informed Consent: I have reviewed the patients History and Physical, chart, labs and discussed the procedure including the risks, benefits and alternatives for the proposed anesthesia with the patient or authorized representative who has indicated his/her understanding and acceptance.   Dental advisory given  Plan Discussed with: CRNA and Surgeon  Anesthesia Plan Comments:         Anesthesia Quick Evaluation

## 2014-12-13 NOTE — Anesthesia Postprocedure Evaluation (Signed)
  Anesthesia Post-op Note  Patient: Anne Hickman  Procedure(s) Performed: Procedure(s): ESOPHAGOGASTRODUODENOSCOPY (EGD) WITH PROPOFOL (N/A)  Anesthesia type:General  Patient location: PACU  Post pain: Pain level controlled  Post assessment: Post-op Vital signs reviewed, Patient's Cardiovascular Status Stable, Respiratory Function Stable, Patent Airway and No signs of Nausea or vomiting  Post vital signs: Reviewed and stable  Last Vitals:  Filed Vitals:   12/13/14 1230  BP: 127/74  Pulse: 66  Temp:   Resp: 16    Level of consciousness: awake, alert  and patient cooperative  Complications: No apparent anesthesia complications

## 2014-12-13 NOTE — Anesthesia Procedure Notes (Signed)
Performed by: Kennon Holter Pre-anesthesia Checklist: Timeout performed, Patient being monitored, Suction available and Emergency Drugs available Patient Re-evaluated:Patient Re-evaluated prior to inductionOxygen Delivery Method: Nasal cannula Intubation Type: IV induction

## 2014-12-13 NOTE — Transfer of Care (Signed)
Immediate Anesthesia Transfer of Care Note  Patient: Anne Hickman  Procedure(s) Performed: Procedure(s): ESOPHAGOGASTRODUODENOSCOPY (EGD) WITH PROPOFOL (N/A)  Patient Location: PACU and Endoscopy Unit  Anesthesia Type:General  Level of Consciousness: awake, alert  and oriented  Airway & Oxygen Therapy: Patient Spontanous Breathing and Patient connected to nasal cannula oxygen  Post-op Assessment: Report given to RN and Post -op Vital signs reviewed and stable  Post vital signs: Reviewed and stable  Last Vitals:  Filed Vitals:   12/13/14 1205  BP:   Pulse:   Temp: 37.1 C  Resp:     Complications: No apparent anesthesia complications

## 2014-12-13 NOTE — Op Note (Signed)
[facility] Gastroenterology Patient Name: Anne Hickman Procedure Date: 12/13/2014 11:37 AM MRN: [id number] Account #: [id number] Date of Birth: [birth year] Admit Type: Outpatient Age: 64 Room: [facility] ENDO ROOM 4 Gender: Female Note Status: Finalized Procedure:         Upper GI endoscopy Indications:       Anemia, Abnormal CT of the GI tract Providers:         Manya Silvas, MD Referring MD:      Glendon Axe (Referring MD) Medicines:         Propofol per Anesthesia Complications:     No immediate complications. Procedure:         Pre-Anesthesia Assessment:                    - After reviewing the risks and benefits, the patient was                     deemed in satisfactory condition to undergo the procedure.                    After obtaining informed consent, the endoscope was passed                     under direct vision. Throughout the procedure, the                     patient's blood pressure, pulse, and oxygen saturations                     were monitored continuously. The Endoscope was introduced                     through the mouth, and advanced to the second part of                     duodenum. The upper GI endoscopy was accomplished without                     difficulty. The patient tolerated the procedure well. Findings:      Grade II varices were found in the middle third of the esophagus, in the       lower third of the esophagus, at the lower esophageal sphincter and at       the gastroesophageal junction.      Diffuse moderate inflammation characterized by congestion (edema),       erythema, friability and granularity was found in the gastric body, in       the gastric antrum and in the prepyloric region of the stomach. A biopsy       taken with a cold forceps for histology. Edema and inflammation are       worse in the antral and prepyloric mucosa. Body of stomach did not have       the edema of the antrum. Diffuse mild gastritis  seen.      The examined duodenum showed diffuse inflammation esp of the bulb. Impression:        - Grade II esophageal varices.                    - Gastritis. Biopsied.                    - Normal examined duodenum. Recommendation:    - Await pathology results. Manya Silvas,  MD 12/13/2014 12:03:47 PM This report has been signed electronically. Number of Addenda: 0 Note Initiated On: 12/13/2014 11:37 AM      [facility]

## 2014-12-15 ENCOUNTER — Encounter: Payer: Self-pay | Admitting: Unknown Physician Specialty

## 2014-12-15 LAB — SURGICAL PATHOLOGY

## 2015-02-19 ENCOUNTER — Encounter: Payer: Self-pay | Admitting: Emergency Medicine

## 2015-02-19 DIAGNOSIS — R109 Unspecified abdominal pain: Secondary | ICD-10-CM | POA: Diagnosis present

## 2015-02-19 DIAGNOSIS — K2970 Gastritis, unspecified, without bleeding: Secondary | ICD-10-CM | POA: Insufficient documentation

## 2015-02-19 DIAGNOSIS — K7031 Alcoholic cirrhosis of liver with ascites: Secondary | ICD-10-CM | POA: Insufficient documentation

## 2015-02-19 DIAGNOSIS — D696 Thrombocytopenia, unspecified: Secondary | ICD-10-CM | POA: Diagnosis not present

## 2015-02-19 DIAGNOSIS — R011 Cardiac murmur, unspecified: Secondary | ICD-10-CM | POA: Diagnosis not present

## 2015-02-19 NOTE — ED Notes (Signed)
Pt presents to ED with c/o lower abdominal pain with nausea, vomiting, and diarrhea. Pt also reports rectal bleed with bright red blood and tarry black stool and bloody vomit.

## 2015-02-20 ENCOUNTER — Emergency Department: Payer: BLUE CROSS/BLUE SHIELD

## 2015-02-20 ENCOUNTER — Emergency Department
Admission: EM | Admit: 2015-02-20 | Discharge: 2015-02-20 | Discharge status: Against Medical Advice | Payer: BLUE CROSS/BLUE SHIELD | Attending: Emergency Medicine | Admitting: Emergency Medicine

## 2015-02-20 DIAGNOSIS — K7031 Alcoholic cirrhosis of liver with ascites: Secondary | ICD-10-CM

## 2015-02-20 DIAGNOSIS — R109 Unspecified abdominal pain: Secondary | ICD-10-CM

## 2015-02-20 DIAGNOSIS — Z5329 Procedure and treatment not carried out because of patient's decision for other reasons: Secondary | ICD-10-CM

## 2015-02-20 DIAGNOSIS — Z5320 Procedure and treatment not carried out because of patient's decision for unspecified reasons: Secondary | ICD-10-CM

## 2015-02-20 DIAGNOSIS — K2970 Gastritis, unspecified, without bleeding: Secondary | ICD-10-CM

## 2015-02-20 DIAGNOSIS — D696 Thrombocytopenia, unspecified: Secondary | ICD-10-CM

## 2015-02-20 LAB — URINALYSIS COMPLETE WITH MICROSCOPIC (ARMC ONLY)
BILIRUBIN URINE: UNDETERMINED
Glucose, UA: UNDETERMINED mg/dL
Hgb urine dipstick: UNDETERMINED
KETONES UR: UNDETERMINED mg/dL
LEUKOCYTES UA: NEGATIVE
Nitrite: NEGATIVE
Protein, ur: UNDETERMINED mg/dL
Specific Gravity, Urine: 1.024 (ref 1.005–1.030)
pH: UNDETERMINED (ref 5.0–8.0)

## 2015-02-20 LAB — COMPREHENSIVE METABOLIC PANEL
ALK PHOS: 188 U/L — AB (ref 38–126)
ALT: 48 U/L (ref 14–54)
AST: 266 U/L — AB (ref 15–41)
Albumin: 3 g/dL — ABNORMAL LOW (ref 3.5–5.0)
Anion gap: 10 (ref 5–15)
BUN: 8 mg/dL (ref 6–20)
CO2: 24 mmol/L (ref 22–32)
CREATININE: 0.32 mg/dL — AB (ref 0.44–1.00)
Calcium: 8.4 mg/dL — ABNORMAL LOW (ref 8.9–10.3)
Chloride: 98 mmol/L — ABNORMAL LOW (ref 101–111)
GFR calc Af Amer: >60 mL/min (ref >60)
GFR calc non Af Amer: >60 mL/min (ref >60)
Glucose, Bld: 90 mg/dL (ref 65–99)
Potassium: 3.4 mmol/L — ABNORMAL LOW (ref 3.5–5.1)
Sodium: 132 mmol/L — ABNORMAL LOW (ref 135–145)
TOTAL PROTEIN: 8.2 g/dL — AB (ref 6.5–8.1)
Total Bilirubin: 5.8 mg/dL — ABNORMAL HIGH (ref 0.3–1.2)

## 2015-02-20 LAB — LIPASE, BLOOD: Lipase: 62 U/L — ABNORMAL HIGH (ref 22–51)

## 2015-02-20 LAB — CBC
HCT: 31.2 % — ABNORMAL LOW (ref 35.0–47.0)
Hemoglobin: 10.6 g/dL — ABNORMAL LOW (ref 12.0–16.0)
MCH: 33.1 pg (ref 26.0–34.0)
MCHC: 34.1 g/dL (ref 32.0–36.0)
MCV: 97 fL (ref 80.0–100.0)
PLATELETS: 79 10*3/uL — AB (ref 150–440)
RBC: 3.22 MIL/uL — ABNORMAL LOW (ref 3.80–5.20)
RDW: 14.3 % (ref 11.5–14.5)
WBC: 8.2 10*3/uL (ref 3.6–11.0)

## 2015-02-20 LAB — ETHANOL: Alcohol, Ethyl (B): 70 mg/dL — ABNORMAL HIGH (ref <5)

## 2015-02-20 NOTE — ED Provider Notes (Signed)
[facility] Emergency Department Provider Note  ____________________________________________  Time seen: Approximately 242 AM  I have reviewed the triage vital signs and the nursing notes.   HISTORY  Chief Complaint Abdominal Pain    HPI Anne Hickman is a 64 y.o. female who comes in today reporting internal bleeding. The patient reports that she is bleeding internally or comes up either in her vomit or in her dark stool. The patient reports that she feels as though her organs are out of whack. The patient reports that she's had lots of nosebleeds and easy bleeding over the last 2-3 weeks. The patient reports that [facility] and nothing was ever found. The patient reports she had an upper GI few weeks ago and was never told her results from primary care physician. The patient reports that she was looking at her paperwork from her last visit and said that she was anemic. The patient's head by some iron and some B vitamins and prenatal vitamins but was concerned about if she should be taking these or not. The patient reports that she went to her doctor's only once and that she is currently looking for another physician. The patient reports that she decided to come in today for evaluation. She denies any abdominal pain currently but reports that she does occasionally have abdominal pain. She reports that she's lost her appetite and isn't able to eat much only drink things. The patient reports though she is on a Librium taper for alcohol abuse. The patient reports that she hasn't drank in 2-3 days. The patient was concerned of everything this been going on so she decided to come in.   Past Medical History  Diagnosis Date  . Insomnia   . Generalized anxiety disorder   . Other and unspecified hyperlipidemia   . Unspecified hypothyroidism   . Alcohol abuse, episodic   . Eczema herpeticum     Patient Active Problem List    Diagnosis Date Noted  . Adult hypothyroidism 11/17/2014  . Polyclonal gammopathy determined by serum protein electrophoresis 11/17/2014  . ALC (alcoholic liver cirrhosis) ([place]) 09/22/2014  . Absolute anemia 09/22/2014  . Calculus of gallbladder 09/22/2014  . History of melena 09/22/2014  . Dizziness, nonspecific 07/09/2014  . Cervical adenopathy 07/09/2014  . Pain in joint, ankle and foot 10/07/2013  . Hepatitis, alcoholic, acute 68/34/1962  . Hypomagnesemia 07/19/2013  . Alcohol abuse 02/11/2012  . GAD (generalized anxiety disorder) 02/11/2012  . Unspecified hypothyroidism 02/11/2012    Past Surgical History  Procedure Laterality Date  . Appendectomy  1978  . Abdominal hysterectomy      Partial   . Esophagogastroduodenoscopy (egd) with propofol N/A 12/13/2014    Procedure: ESOPHAGOGASTRODUODENOSCOPY (EGD) WITH PROPOFOL;  Surgeon: Manya Silvas, MD;  Location: [place];  Service: Endoscopy;  Laterality: N/A;    No current outpatient prescriptions on file.  Allergies Bee venom; Advil; Aleve; Betadine; Codeine; and Povidone-iodine  Family History  Problem Relation Age of Onset  . Coronary artery disease Father   . Heart disease Father   . Breast cancer Maternal Aunt 60  . Mental illness Mother     Social History Social History  Substance Use Topics  . Smoking status: Never Smoker   . Smokeless tobacco: Never Used  . Alcohol Use: 12.6 oz/week    21 Glasses of wine per week     Comment: 1- 2 bottles of wine a day    Review  of Systems Constitutional: No fever/chills Eyes: No visual changes. ENT: No sore throat. Cardiovascular: Denies chest pain. Respiratory: Denies shortness of breath. Gastrointestinal: Occasional abdominal pain. Hematemesis and blood in diarrhea and dark stools Genitourinary: Negative for dysuria. Musculoskeletal: Negative for back pain. Skin: Negative for rash. Neurological: Negative for headaches, focal weakness or  numbness.  10-point ROS otherwise negative.  ____________________________________________   PHYSICAL EXAM:  VITAL SIGNS: ED Triage Vitals  Enc Vitals Group     BP 02/19/15 2307 128/80 mmHg     Pulse Rate 02/19/15 2307 91     Resp 02/19/15 2307 20     Temp 02/19/15 2307 98.3 F (36.8 C)     Temp Source 02/19/15 2307 Oral     SpO2 02/19/15 2307 96 %     Weight 02/19/15 2307 130 lb (58.968 kg)     Height 02/19/15 2307 5\' 5"  (1.651 m)     Head Cir --      Peak Flow --      Pain Score 02/19/15 2302 0     Pain Loc --      Pain Edu? --      Excl. in [place]? --     Constitutional: Alert and oriented. Ill appearing and in water it distress. Eyes: Conjunctivae are normal. PERRL. EOMI. Head: Atraumatic. Nose: No congestion/rhinnorhea. Mouth/Throat: Mucous membranes are moist.  Oropharynx non-erythematous. Cardiovascular: Normal rate, regular rhythm. Holosystolic murmur  Good peripheral circulation. Respiratory: Normal respiratory effort.  No retractions. Lungs CTAB. Gastrointestinal: Soft and nontender. No distention. Positive bowel sounds Musculoskeletal: No lower extremity tenderness nor edema.  Neurologic:  Normal speech and language.  Skin:  Jaundice, Skin is warm, dry and intact.  Psychiatric: Mood and affect are normal.   ____________________________________________   LABS (all labs ordered are listed, but only abnormal results are displayed)  Labs Reviewed  LIPASE, BLOOD - Abnormal; Notable for the following:    Lipase 62 (*)    All other components within normal limits  COMPREHENSIVE METABOLIC PANEL - Abnormal; Notable for the following:    Sodium 132 (*)    Potassium 3.4 (*)    Chloride 98 (*)    Creatinine, Ser 0.32 (*)    Calcium 8.4 (*)    Total Protein 8.2 (*)    Albumin 3.0 (*)    AST 266 (*)    Alkaline Phosphatase 188 (*)    Total Bilirubin 5.8 (*)    All other components within normal limits  CBC - Abnormal; Notable for the following:    RBC 3.22 (*)     Hemoglobin 10.6 (*)    HCT 31.2 (*)    Platelets 79 (*)    All other components within normal limits  URINALYSIS COMPLETEWITH MICROSCOPIC ([facility] ONLY) - Abnormal; Notable for the following:    Color, Urine ORANGE (*)    APPearance CLEAR (*)    Bacteria, UA MANY (*)    Squamous Epithelial / LPF 6-30 (*)    All other components within normal limits  ETHANOL - Abnormal; Notable for the following:    Alcohol, Ethyl (B) 70 (*)    All other components within normal limits   ____________________________________________  EKG  none ____________________________________________  RADIOLOGY  [place] RUQ: No acute abnormality seen to explain the patient's symptoms, Findings suggest mild hepatic cirrhosis with underlying fatty infiltration of the liver, bidirectional blood flow noted with the portal vein. Trace ascites noted tracking about the liver and mild ascites at the lower quadrants of the abdomen  ____________________________________________   PROCEDURES  Procedure(s) performed: None  Critical Care performed: No  ____________________________________________   INITIAL IMPRESSION / ASSESSMENT AND PLAN / ED COURSE  Pertinent labs & imaging results that were available during my care of the patient were reviewed by me and considered in my medical decision making (see chart for details).  This is a 64 year old female who comes in with multiple planes with a concern for a GI bleed. The patient went for an ultrasound as she does have some jaundice as well as elevated bilirubin and transaminitis. The patient has cirrhosis in 1 back to ask her she explained that she was diagnosed with cirrhosis a few months ago. The patient is also been continuing drinking and has an alcohol level here today of 70. Given the patient's low platelets and liver failure I'm concerned that the patient's bleed and we due to a coagulopathy. I informed the patient I wanted to admit her to the hospital but she decided she  did not want to stay. The patient reports that she had been here all night and she decided she wanted to go home and go to sleep. I informed her that with her liver being unable to clot if she continues to vomit blood and drink she may bleed and die. The patient reports that she understands that risk and she would rather follow-up with her GI physician. The patient decided that she would sign the [place] paperwork will be discharged [place]. The patient will follow-up with her physician.  02/20/2015 at 8:50 AM:  The patient requested to leave.  I considered this to be leaving against medical advice. I personally discussed the following with them:  1)  That they currently had a medical condition cirrhosis and GI bleed and I am concerned that they may have liver failure.   2)  My proposed course of evaluation and treatment includes, but is not limited to,  Admission to the hospital.    3) Risks of leaving before this had been completed include: misdiagnosis, worsening illness leading up to and including prolonged or permanent disability or death.  Specific risks pertinent, but not all inclusive, of their current medical condition include but are not limited to worsened GI bleed. Fulminant liver failure   Despite this they stated they wanted to leave due to not wanting to stay in the hospital and being tired. and refused further evaluation, treatment, or admission at this time.   They appeared clinically sober, were mentating appropriately, were free from distracting injury, had adequately controlled acute pain, appeared to have intact insight, judgment, and reason, and in my opinion had the capacity to make this decision.  Specifically, they were able to verbally state back in a coherent manner their current medical condition/current diagnosis, the proposed course of evaluation and/or treatment, and the risks, benefits, and alternatives of treatment versus leaving against  medical advice.   They understand that they may return to seek medical attention here at ANY time they want.  I strongly advised them to return to the Emergency Department immediately if they experience any new or worsening symptoms that concern them, or simply if they reconsider continued evaluation and/or treatment as previously discussed.  This would be without any repercussions, though they understand they likely will need to wait again in the Emergency Department if other patients are in front of them, rather than being brought straight back.  They understood this is another advantage of staying, but still insisted upon leaving.  I recommended they follow-up with GI physician at the earliest available opportunity/appointment for further evaluation and treatment.   The patient was discharged against medical advice.  They did accept written discharge instructions.   ____________________________________________   FINAL CLINICAL IMPRESSION(S) / ED DIAGNOSES  Final diagnoses:  Abdominal pain  Left against medical advice  Alcoholic cirrhosis of liver with ascites ([place])  Gastritis  Thrombocytopenia ([place])      Loney Hering, MD 02/20/15 [phone]

## 2015-02-20 NOTE — Discharge Instructions (Signed)
Gastritis, Adult Gastritis is soreness and puffiness (inflammation) of the lining of the stomach. If you do not get help, gastritis can cause bleeding and sores (ulcers) in the stomach. HOME CARE   Only take medicine as told by your doctor.  If you were given antibiotic medicines, take them as told. Finish the medicines even if you start to feel better.  Drink enough fluids to keep your pee (urine) clear or pale yellow.  Avoid foods and drinks that make your problems worse. Foods you may want to avoid include:  Caffeine or alcohol.  Chocolate.  Mint.  Garlic and onions.  Spicy foods.  Citrus fruits, including oranges, lemons, or limes.  Food containing tomatoes, including sauce, chili, salsa, and pizza.  Fried and fatty foods.  Eat small meals throughout the day instead of large meals. GET HELP RIGHT AWAY IF:   You have black or dark red poop (stools).  You throw up (vomit) blood. It may look like coffee grounds.  You cannot keep fluids down.  Your belly (abdominal) pain gets worse.  You have a fever.  You do not feel better after 1 week.  You have any other questions or concerns. MAKE SURE YOU:   Understand these instructions.  Will watch your condition.  Will get help right away if you are not doing well or get worse. Document Released: 10/22/2007 Document Revised: 07/28/2011 Document Reviewed: 06/18/2011 [facility] Patient Information 2015 [place], [place]. This information is not intended to replace advice given to you by your health care provider. Make sure you discuss any questions you have with your health care provider.  Cirrhosis Cirrhosis is a condition of scarring of the liver which is caused when the liver has tried repairing itself following damage. This damage may come from a previous infection such as one of the forms of hepatitis (usually hepatitis C), or the damage may come from being injured by toxins. The main toxin that causes this damage is  alcohol. The scarring of the liver from use of alcohol is irreversible. That means the liver cannot return to normal even though alcohol is not used any more. The main danger of hepatitis C infection is that it may cause long-lasting (chronic) liver disease, and this also may lead to cirrhosis. This complication is progressive and irreversible. CAUSES  Prior to available blood tests, hepatitis C could be contracted by blood transfusions. Since testing of blood has improved, this is now unlikely. This infection can also be contracted through intravenous drug use and the sharing of needles. It can also be contracted through sexual relationships. The injury caused by alcohol comes from too much use. It is not a few drinks that poison the liver, but years of misuse. Usually there will be some signs and symptoms early with scarring of the liver that suggest the development of better habits. Alcohol should never be used while using acetaminophen. A small dose of both taken together may cause irreversible damage to the liver. HOME CARE INSTRUCTIONS  There is no specific treatment for cirrhosis. However, there are things you can do to avoid making the condition worse.  Rest as needed.  Eat a well-balanced diet. Your caregiver can help you with suggestions.  Vitamin supplements including vitamins A, K, D, and thiamine can help.  A low-salt diet, water restriction, or diuretic medicine may be needed to reduce fluid retention.  Avoid alcohol. This can be extremely toxic if combined with acetaminophen.  Avoid drugs which are toxic to the liver. Some of  these include isoniazid, methyldopa, acetaminophen, anabolic steroids (muscle-building drugs), erythromycin, and oral contraceptives (birth control pills). Check with your caregiver to make sure medicines you are presently taking will not be harmful.  Periodic blood tests may be required. Follow your caregiver's advice regarding the timing of these.  Milk  thistle is an herbal remedy which does protect the liver against toxins. However, it will not help once the liver has been scarred. SEEK MEDICAL CARE IF:  You have increasing fatigue or weakness.  You develop swelling of the hands, feet, legs, or face.  You vomit bright red blood, or a coffee ground appearing material.  You have blood in your stools, or the stools turn black and tarry.  You have a fever.  You develop loss of appetite, or have nausea and vomiting.  You develop jaundice.  You develop easy bruising or bleeding.  You have worsening of any of the problems you are concerned about. Document Released: 05/05/2005 Document Revised: 07/28/2011 Document Reviewed: 12/22/2007 [facility] Patient Information 2015 [place], [place]. This information is not intended to replace advice given to you by your health care provider. Make sure you discuss any questions you have with your health care provider.  Thrombocytopenia Thrombocytopenia is a condition in which there is an abnormally small number of platelets in your blood. Platelets are also called thrombocytes. Platelets are needed for blood clotting. CAUSES Thrombocytopenia is caused by:   Decreased production of platelets. This can be caused by:  Aplastic anemia in which your bone marrow quits making blood cells.  Cancer in the bone marrow.  Use of certain medicines, including chemotherapy.  Infection in the bone marrow.  Heavy alcohol consumption.  Increased destruction of platelets. This can be caused by:  Certain immune diseases.  Use of certain drugs.  Certain blood clotting disorders.  Certain inherited disorders.  Certain bleeding disorders.  Pregnancy.  Having an enlarged spleen (hypersplenism). In hypersplenism, the spleen gathers up platelets from circulation. This means the platelets are not available to help with blood clotting. The spleen can enlarge due to cirrhosis or other conditions. SYMPTOMS  The  symptoms of thrombocytopenia are side effects of poor blood clotting. Some of these are:  Abnormal bleeding.  Nosebleeds.  Heavy menstrual periods.  Blood in the urine or stools.  Purpura. This is a purplish discoloration in the skin produced by small bleeding vessels near the surface of the skin.  Bruising.  A rash that may be petechial. This looks like pinpoint, purplish-red spots on the skin and mucous membranes. It is caused by bleeding from small blood vessels (capillaries). DIAGNOSIS  Your caregiver will make this diagnosis based on your exam and blood tests. Sometimes, a bone marrow study is done to look for the original cells (megakaryocytes) that make platelets. TREATMENT  Treatment depends on the cause of the condition.  Medicines may be given to help protect your platelets from being destroyed.  In some cases, a replacement (transfusion) of platelets may be required to stop or prevent bleeding.  Sometimes, the spleen must be surgically removed. HOME CARE INSTRUCTIONS   Check the skin and linings inside your mouth for bruising or bleeding as directed by your caregiver.  Check your sputum, urine, and stool for blood as directed by your caregiver.  Do not return to any activities that could cause bumps or bruises until your caregiver says it is okay.  Take extra care not to cut yourself when shaving or when using scissors, needles, knives, and other tools.  Take extra care not to burn yourself when ironing or cooking.  Ask your caregiver if it is okay for you to drink alcohol.  Only take over-the-counter or prescription medicines as directed by your caregiver.  Notify all your caregivers, including dentists and eye doctors, about your condition. SEEK IMMEDIATE MEDICAL CARE IF:   You develop active bleeding from anywhere in your body.  You develop unexplained bruising or bleeding.  You have blood in your sputum, urine, or stool. MAKE SURE YOU:  Understand  these instructions.  Will watch your condition.  Will get help right away if you are not doing well or get worse. Document Released: 05/05/2005 Document Revised: 07/28/2011 Document Reviewed: 03/07/2011 [facility] Patient Information 2015 [place], [place]. This information is not intended to replace advice given to you by your health care provider. Make sure you discuss any questions you have with your health care provider.

## 2015-05-20 ENCOUNTER — Encounter: Payer: Self-pay | Admitting: Emergency Medicine

## 2015-05-20 ENCOUNTER — Emergency Department
Admission: EM | Admit: 2015-05-20 | Discharge: 2015-05-20 | Disposition: A | Discharge status: Home / Self Care | Payer: BLUE CROSS/BLUE SHIELD | Attending: Emergency Medicine | Admitting: Emergency Medicine

## 2015-05-20 ENCOUNTER — Emergency Department: Payer: BLUE CROSS/BLUE SHIELD

## 2015-05-20 DIAGNOSIS — W01198A Fall on same level from slipping, tripping and stumbling with subsequent striking against other object, initial encounter: Secondary | ICD-10-CM | POA: Diagnosis not present

## 2015-05-20 DIAGNOSIS — S51812A Laceration without foreign body of left forearm, initial encounter: Secondary | ICD-10-CM | POA: Diagnosis present

## 2015-05-20 DIAGNOSIS — S5002XA Contusion of left elbow, initial encounter: Secondary | ICD-10-CM | POA: Insufficient documentation

## 2015-05-20 DIAGNOSIS — Y9389 Activity, other specified: Secondary | ICD-10-CM | POA: Diagnosis not present

## 2015-05-20 DIAGNOSIS — R296 Repeated falls: Secondary | ICD-10-CM | POA: Diagnosis not present

## 2015-05-20 DIAGNOSIS — Y9289 Other specified places as the place of occurrence of the external cause: Secondary | ICD-10-CM | POA: Diagnosis not present

## 2015-05-20 DIAGNOSIS — Y998 Other external cause status: Secondary | ICD-10-CM | POA: Diagnosis not present

## 2015-05-20 MED ORDER — DOXYCYCLINE HYCLATE 100 MG PO CAPS: 100.0000 mg | ORAL_CAPSULE | Freq: Two times a day (BID) | ORAL | Status: DC

## 2015-05-20 MED ORDER — LIDOCAINE-EPINEPHRINE (PF) 1 %-1:200000 IJ SOLN
30.0000 mL | Freq: Once | INTRAMUSCULAR | Status: AC
  Administered 2015-05-20: 30 mL
  Filled 2015-05-20: qty 30

## 2015-05-20 MED ORDER — ONDANSETRON HCL 4 MG PO TABS: 4.0000 mg | ORAL_TABLET | Freq: Three times a day (TID) | ORAL | Status: DC | PRN

## 2015-05-20 MED ORDER — TRAMADOL HCL 50 MG PO TABS: 50.0000 mg | ORAL_TABLET | Freq: Four times a day (QID) | ORAL | Status: DC | PRN

## 2015-05-20 NOTE — ED Notes (Signed)
Pt presents with left forearm laceration, fell on Thursday and was seen at [facility]. Pt was sent home with bandage in place. Pt presents today with open wound to left arm after taking dressing off this am. Pt states they did not xray her arm on Thursday.

## 2015-05-20 NOTE — Discharge Instructions (Signed)
Laceration Care, Adult  A laceration is a cut that goes through all layers of the skin. The cut also goes into the tissue that is right under the skin. Some cuts heal on their own. Others need to be closed with stitches (sutures), staples, skin adhesive strips, or wound glue. Taking care of your cut lowers your risk of infection and helps your cut to heal better.  HOW TO TAKE CARE OF YOUR CUT  For stitches or staples:  · Keep the wound clean and dry.  · If you were given a bandage (dressing), you should change it at least one time per day or as told by your doctor. You should also change it if it gets wet or dirty.  · Keep the wound completely dry for the first 24 hours or as told by your doctor. After that time, you may take a shower or a bath. However, make sure that the wound is not soaked in water until after the stitches or staples have been removed.  · Clean the wound one time each day or as told by your doctor:    Wash the wound with soap and water.    Rinse the wound with water until all of the soap comes off.    Pat the wound dry with a clean towel. Do not rub the wound.  · After you clean the wound, put a thin layer of antibiotic ointment on it as told by your doctor. This ointment:    Helps to prevent infection.    Keeps the bandage from sticking to the wound.  · Have your stitches or staples removed as told by your doctor.  If your doctor used skin adhesive strips:   · Keep the wound clean and dry.  · If you were given a bandage, you should change it at least one time per day or as told by your doctor. You should also change it if it gets dirty or wet.  · Do not get the skin adhesive strips wet. You can take a shower or a bath, but be careful to keep the wound dry.  · If the wound gets wet, pat it dry with a clean towel. Do not rub the wound.  · Skin adhesive strips fall off on their own. You can trim the strips as the wound heals. Do not remove any strips that are still stuck to the wound. They will  fall off after a while.  If your doctor used wound glue:  · Try to keep your wound dry, but you may briefly wet it in the shower or bath. Do not soak the wound in water, such as by swimming.  · After you take a shower or a bath, gently pat the wound dry with a clean towel. Do not rub the wound.  · Do not do any activities that will make you really sweaty until the skin glue has fallen off on its own.  · Do not apply liquid, cream, or ointment medicine to your wound while the skin glue is still on.  · If you were given a bandage, you should change it at least one time per day or as told by your doctor. You should also change it if it gets dirty or wet.  · If a bandage is placed over the wound, do not let the tape for the bandage touch the skin glue.  · Do not pick at the glue. The skin glue usually stays on for 5-10 days. Then, it   falls off of the skin.  General Instructions   · To help prevent scarring, make sure to cover your wound with sunscreen whenever you are outside after stitches are removed, after adhesive strips are removed, or when wound glue stays in place and the wound is healed. Make sure to wear a sunscreen of at least 30 SPF.  · Take over-the-counter and prescription medicines only as told by your doctor.  · If you were given antibiotic medicine or ointment, take or apply it as told by your doctor. Do not stop using the antibiotic even if your wound is getting better.  · Do not scratch or pick at the wound.  · Keep all follow-up visits as told by your doctor. This is important.  · Check your wound every day for signs of infection. Watch for:    Redness, swelling, or pain.    Fluid, blood, or pus.  · Raise (elevate) the injured area above the level of your heart while you are sitting or lying down, if possible.  GET HELP IF:  · You got a tetanus shot and you have any of these problems at the injection site:    Swelling.    Very bad pain.    Redness.    Bleeding.  · You have a fever.  · A wound that was  closed breaks open.  · You notice a bad smell coming from your wound or your bandage.  · You notice something coming out of the wound, such as wood or glass.  · Medicine does not help your pain.  · You have more redness, swelling, or pain at the site of your wound.  · You have fluid, blood, or pus coming from your wound.  · You notice a change in the color of your skin near your wound.  · You need to change the bandage often because fluid, blood, or pus is coming from the wound.  · You start to have a new rash.  · You start to have numbness around the wound.  GET HELP RIGHT AWAY IF:  · You have very bad swelling around the wound.  · Your pain suddenly gets worse and is very bad.  · You notice painful lumps near the wound or on skin that is anywhere on your body.  · You have a red streak going away from your wound.  · The wound is on your hand or foot and you cannot move a finger or toe like you usually can.  · The wound is on your hand or foot and you notice that your fingers or toes look pale or bluish.     This information is not intended to replace advice given to you by your health care provider. Make sure you discuss any questions you have with your health care provider.     Document Released: 10/22/2007 Document Revised: 09/19/2014 Document Reviewed: 05/01/2014  Elsevier Interactive Patient Education ©2016 [employer]

## 2015-05-20 NOTE — ED Notes (Addendum)
Pt was going to "air out" wound today and after looking at did not feel it was healing properly and wanted to be re-evaluated. Pt fell on Thursday night/Friday morning and was seen at an urgent care.

## 2015-05-20 NOTE — ED Notes (Signed)
States she fell   Laceration to left forearm 2 days ago  Was seen at [place] and told to come here for eval

## 2015-05-20 NOTE — ED Provider Notes (Signed)
[facility] Emergency Department Provider Note ____________________________________________  Time seen: Approximately 1:33 PM  I have reviewed the triage vital signs and the nursing notes.   HISTORY  Chief Complaint Extremity Laceration   HPI Anne Hickman is a 65 y.o. female who presents to the emergency department for evaluation of left forearm pain and laceration. She has a history of frequent falls. Her husband states that she got up and was not aware of it and fell. Her arm scraped down the edge of a chair, causing the skin to tear. She denies loss of consciousness or other injury. She was evaluated at [facility] on Friday morning. Husband states that provider "cut off the dead skin and wrapped it up." Pain is much worse today.    Past Medical History  Diagnosis Date  . Insomnia   . Generalized anxiety disorder   . Other and unspecified hyperlipidemia   . Unspecified hypothyroidism   . Alcohol abuse, episodic   . Eczema herpeticum     Patient Active Problem List   Diagnosis Date Noted  . Adult hypothyroidism 11/17/2014  . Polyclonal gammopathy determined by serum protein electrophoresis 11/17/2014  . ALC (alcoholic liver cirrhosis) ([place]) 09/22/2014  . Absolute anemia 09/22/2014  . Calculus of gallbladder 09/22/2014  . History of melena 09/22/2014  . Dizziness, nonspecific 07/09/2014  . Cervical adenopathy 07/09/2014  . Pain in joint, ankle and foot 10/07/2013  . Hepatitis, alcoholic, acute 123XX123  . Hypomagnesemia 07/19/2013  . Alcohol abuse 02/11/2012  . GAD (generalized anxiety disorder) 02/11/2012  . Unspecified hypothyroidism 02/11/2012    Past Surgical History  Procedure Laterality Date  . Appendectomy  1978  . Abdominal hysterectomy      Partial   . Esophagogastroduodenoscopy (egd) with propofol N/A 12/13/2014    Procedure: ESOPHAGOGASTRODUODENOSCOPY (EGD) WITH PROPOFOL;  Surgeon: Manya Silvas, MD;  Location: [place];   Service: Endoscopy;  Laterality: N/A;    Current Outpatient Rx  Name  Route  Sig  Dispense  Refill  . doxycycline (VIBRAMYCIN) 100 MG capsule   Oral   Take 1 capsule (100 mg total) by mouth 2 (two) times daily.   20 capsule   0   . ondansetron (ZOFRAN) 4 MG tablet   Oral   Take 1 tablet (4 mg total) by mouth every 8 (eight) hours as needed for nausea or vomiting.   20 tablet   0   . traMADol (ULTRAM) 50 MG tablet   Oral   Take 1 tablet (50 mg total) by mouth every 6 (six) hours as needed.   12 tablet   0     Allergies Bee venom; Advil; Aleve; Betadine; Codeine; and Povidone-iodine  Family History  Problem Relation Age of Onset  . Coronary artery disease Father   . Heart disease Father   . Breast cancer Maternal Aunt 60  . Mental illness Mother     Social History Social History  Substance Use Topics  . Smoking status: Never Smoker   . Smokeless tobacco: Never Used  . Alcohol Use: 12.6 oz/week    21 Glasses of wine per week     Comment: 1- 2 bottles of wine a day    Review of Systems Constitutional: No recent illness. Eyes: No visual changes. ENT: No sore throat. Cardiovascular: Denies chest pain or palpitations. Respiratory: Denies shortness of breath. Gastrointestinal: No abdominal pain.  Genitourinary: Negative for dysuria. Musculoskeletal: Pain in left forearm. Skin: Negative for rash. Neurological: Negative for headaches, focal weakness  or numbness. 10-point ROS otherwise negative.  ____________________________________________   PHYSICAL EXAM:  VITAL SIGNS: ED Triage Vitals  Enc Vitals Group     BP 05/20/15 1259 119/68 mmHg     Pulse Rate 05/20/15 1259 78     Resp 05/20/15 1259 18     Temp 05/20/15 1259 97.7 F (36.5 C)     Temp Source 05/20/15 1259 Oral     SpO2 05/20/15 1259 99 %     Weight 05/20/15 1259 138 lb (62.596 kg)     Height 05/20/15 1259 5\' 4"  (1.626 m)     Head Cir --      Peak Flow --      Pain Score 05/20/15 1300 10      Pain Loc --      Pain Edu? --      Excl. in [place]? --     Constitutional: Alert and oriented. Well appearing and in no acute distress. Eyes: Conjunctivae are normal. EOMI. Head: Atraumatic. Nose: No congestion/rhinnorhea. Neck: No stridor.  Respiratory: Normal respiratory effort.   Musculoskeletal: Left forearm pain and swelling. Neurologic:  Normal speech and language. No gross focal neurologic deficits are appreciated. Speech is normal. No gait instability. Skin: Ecchymosis noted to left forearm and elbow. Laceration with exposed subcutaneous  tissue and muscle exposure measuring approximately 4 cm noted. Second laceration noted to lateral aspect of arm just above left elbow that appears superficial. No bleeding at the time of exam. Psychiatric: Mood and affect are normal. Speech and behavior are normal.  ____________________________________________   LABS (all labs ordered are listed, but only abnormal results are displayed)  Labs Reviewed - No data to display ____________________________________________  RADIOLOGY  Left forearm negative for acute bony abnormality.  I, Sherrie George, personally viewed and evaluated these images (plain radiographs) as part of my medical decision making, as well as reviewing the written report by the radiologist.  ____________________________________________   PROCEDURES  Procedure(s) performed:   LACERATION REPAIR Performed by: Sherrie George Authorized by: Sherrie George Consent: Verbal consent obtained. Risks and benefits: risks, benefits and alternatives were discussed Consent given by: patient Patient identity confirmed: provided demographic data Prepped and Draped in normal sterile fashion Wound explored  Laceration Location: left forearm  Laceration Length: 4 cm  No Foreign Bodies seen or palpated  Anesthesia: local infiltration  Local anesthetic: lidocaine 1% with epinephrine  Anesthetic total: 8 ml  Irrigation method:  syringe  Amount of cleaning: copious  Skin closure: 4-0 Vicryl/ 4-0 Nylon  Number of sutures: 2 subcutaneous/3external  Technique: Very loosely closed--simple interrupted  Patient tolerance: Patient tolerated the procedure well with no immediate complications.    ____________________________________________   INITIAL IMPRESSION / ASSESSMENT AND PLAN / ED COURSE  Pertinent labs & imaging results that were available during my care of the patient were reviewed by me and considered in my medical decision making (see chart for details).  Old wound with muscle and subcutaneous tissue exposed closed very loosely after copious cleansing with NS.  Patient and husband given strict return precautions regarding follow up and risk of infection. Prescription for Doxycycline given and strongly advised to take it until finished. She was advised to follow up with her PCP in 2 days for a wound check. She was advised to have the sutures removed in 10-12 days. Wound care was discussed with the patient and her husband. She states that Neosporin "burns" her skin, so she was instructed to keep the wound clean and dry and change  the bandage 2 times per day.  ____________________________________________   FINAL CLINICAL IMPRESSION(S) / ED DIAGNOSES  Final diagnoses:  Laceration of forearm, left, complicated, initial encounter       Victorino Dike, FNP 05/20/15 1535  Lavonia Drafts, MD 05/21/15 1402

## 2015-05-27 ENCOUNTER — Encounter: Payer: Self-pay | Admitting: Emergency Medicine

## 2015-05-27 ENCOUNTER — Emergency Department
Admission: EM | Admit: 2015-05-27 | Discharge: 2015-05-27 | Disposition: A | Discharge status: Home / Self Care | Payer: BLUE CROSS/BLUE SHIELD | Attending: Emergency Medicine | Admitting: Emergency Medicine

## 2015-05-27 DIAGNOSIS — Z792 Long term (current) use of antibiotics: Secondary | ICD-10-CM | POA: Diagnosis not present

## 2015-05-27 DIAGNOSIS — Z5189 Encounter for other specified aftercare: Secondary | ICD-10-CM

## 2015-05-27 DIAGNOSIS — Z4801 Encounter for change or removal of surgical wound dressing: Secondary | ICD-10-CM | POA: Diagnosis present

## 2015-05-27 NOTE — ED Provider Notes (Signed)
[facility] Emergency Department Provider Note  ____________________________________________  Time seen: On arrival  I have reviewed the triage vital signs and the nursing notes.   HISTORY  Chief Complaint Arm Pain    HPI Anne Hickman is a 65 y.o. female who presents for a wound recheck. Patient reports she had a fall 7 days ago and had 5 stitches placed here at [facility] regional. The wound has been doing well but she reports that it has still been bleeding small amounts into her dressing and when she pulls the dressing off she is pulling off skin and she thinks this is interfering with its healing. Denies redness purulent discharge or increasing pain.    Past Medical History  Diagnosis Date  . Insomnia   . Generalized anxiety disorder   . Other and unspecified hyperlipidemia   . Unspecified hypothyroidism   . Alcohol abuse, episodic   . Eczema herpeticum     Patient Active Problem List   Diagnosis Date Noted  . Adult hypothyroidism 11/17/2014  . Polyclonal gammopathy determined by serum protein electrophoresis 11/17/2014  . ALC (alcoholic liver cirrhosis) ([place]) 09/22/2014  . Absolute anemia 09/22/2014  . Calculus of gallbladder 09/22/2014  . History of melena 09/22/2014  . Dizziness, nonspecific 07/09/2014  . Cervical adenopathy 07/09/2014  . Pain in joint, ankle and foot 10/07/2013  . Hepatitis, alcoholic, acute 123XX123  . Hypomagnesemia 07/19/2013  . Alcohol abuse 02/11/2012  . GAD (generalized anxiety disorder) 02/11/2012  . Unspecified hypothyroidism 02/11/2012    Past Surgical History  Procedure Laterality Date  . Appendectomy  1978  . Abdominal hysterectomy      Partial   . Esophagogastroduodenoscopy (egd) with propofol N/A 12/13/2014    Procedure: ESOPHAGOGASTRODUODENOSCOPY (EGD) WITH PROPOFOL;  Surgeon: Manya Silvas, MD;  Location: [place];  Service: Endoscopy;  Laterality: N/A;    Current Outpatient Rx  Name   Route  Sig  Dispense  Refill  . doxycycline (VIBRAMYCIN) 100 MG capsule   Oral   Take 1 capsule (100 mg total) by mouth 2 (two) times daily.   20 capsule   0   . ondansetron (ZOFRAN) 4 MG tablet   Oral   Take 1 tablet (4 mg total) by mouth every 8 (eight) hours as needed for nausea or vomiting.   20 tablet   0   . traMADol (ULTRAM) 50 MG tablet   Oral   Take 1 tablet (50 mg total) by mouth every 6 (six) hours as needed.   12 tablet   0     Allergies Bee venom; Advil; Aleve; Betadine; Codeine; and Povidone-iodine  Family History  Problem Relation Age of Onset  . Coronary artery disease Father   . Heart disease Father   . Breast cancer Maternal Aunt 60  . Mental illness Mother     Social History Social History  Substance Use Topics  . Smoking status: Never Smoker   . Smokeless tobacco: Never Used  . Alcohol Use: 12.6 oz/week    21 Glasses of wine per week     Comment: 1- 2 bottles of wine a day    Review of Systems  Constitutional: Negative for fever.     Skin: Negative for infection    ____________________________________________   PHYSICAL EXAM:  VITAL SIGNS: ED Triage Vitals  Enc Vitals Group     BP 05/27/15 0822 117/62 mmHg     Pulse Rate 05/27/15 0822 92     Resp 05/27/15 0822 18  Temp 05/27/15 0822 98.2 F (36.8 C)     Temp Source 05/27/15 0822 Oral     SpO2 05/27/15 0822 100 %     Weight 05/27/15 0822 138 lb (62.596 kg)     Height 05/27/15 0822 5\' 4"  (1.626 m)     Head Cir --      Peak Flow --      Pain Score 05/27/15 0822 8     Pain Loc --      Pain Edu? --      Excl. in [place]? --      Constitutional: Alert and oriented. Well appearing and in no distress. Eyes: Conjunctivae are normal.  ENT   Head: Normocephalic and atraumatic.   Respiratory: Normal respiratory effort without tachypnea nor retractions.  .  Neurologic:  Normal speech and language. No gross focal neurologic deficits are appreciated. Skin:  Sutured wound  is healing well at the lateral left proximal forearm. Normal granulation tissue surrounding, no erythema, no tenderness, no discharge. With mild pressure there is still some oozing of blood but no pulsatility. Possible subcutaneous hematoma. Psychiatric: Mood and affect are normal. Patient exhibits appropriate insight and judgment.  ____________________________________________    LABS (pertinent positives/negatives)  Labs Reviewed - No data to display  ____________________________________________     ____________________________________________    RADIOLOGY I have personally reviewed any xrays that were ordered on this patient: None  ____________________________________________   PROCEDURES  Procedure(s) performed: none   ____________________________________________   INITIAL IMPRESSION / ASSESSMENT AND PLAN / ED COURSE  Pertinent labs & imaging results that were available during my care of the patient were reviewed by me and considered in my medical decision making (see chart for details).  Exam consistent with healing wound. Patient is to have sutures removed in 3-5 days. Nonstick dressing applied and wound care discussed with patient and husband  ____________________________________________   FINAL CLINICAL IMPRESSION(S) / ED DIAGNOSES  Final diagnoses:  Visit for wound check     Lavonia Drafts, MD 05/27/15 [phone]

## 2015-05-27 NOTE — ED Notes (Signed)
Pt states was seen here last week post fall and had 5 stitches put in. Pt presents today with c/o pain to the suture area. Some redness noted around the site at this time. Swelling noted to site. Sutures in tact with mild oozing, bleeding controlled at this time.

## 2015-05-30 ENCOUNTER — Inpatient Hospital Stay
Admission: EM | Admit: 2015-05-30 | Discharge: 2015-06-02 | DRG: 640 | Disposition: A | Discharge status: Home Health | Payer: BLUE CROSS/BLUE SHIELD | Attending: Internal Medicine | Admitting: Internal Medicine

## 2015-05-30 ENCOUNTER — Encounter: Payer: Self-pay | Admitting: Emergency Medicine

## 2015-05-30 ENCOUNTER — Emergency Department: Payer: BLUE CROSS/BLUE SHIELD

## 2015-05-30 DIAGNOSIS — D6959 Other secondary thrombocytopenia: Secondary | ICD-10-CM | POA: Diagnosis present

## 2015-05-30 DIAGNOSIS — Z885 Allergy status to narcotic agent status: Secondary | ICD-10-CM

## 2015-05-30 DIAGNOSIS — K8020 Calculus of gallbladder without cholecystitis without obstruction: Secondary | ICD-10-CM | POA: Diagnosis present

## 2015-05-30 DIAGNOSIS — E785 Hyperlipidemia, unspecified: Secondary | ICD-10-CM | POA: Diagnosis present

## 2015-05-30 DIAGNOSIS — F411 Generalized anxiety disorder: Secondary | ICD-10-CM | POA: Diagnosis present

## 2015-05-30 DIAGNOSIS — E039 Hypothyroidism, unspecified: Secondary | ICD-10-CM | POA: Diagnosis present

## 2015-05-30 DIAGNOSIS — Z91030 Bee allergy status: Secondary | ICD-10-CM

## 2015-05-30 DIAGNOSIS — Z886 Allergy status to analgesic agent status: Secondary | ICD-10-CM

## 2015-05-30 DIAGNOSIS — F1020 Alcohol dependence, uncomplicated: Secondary | ICD-10-CM | POA: Diagnosis present

## 2015-05-30 DIAGNOSIS — N390 Urinary tract infection, site not specified: Secondary | ICD-10-CM | POA: Diagnosis present

## 2015-05-30 DIAGNOSIS — E871 Hypo-osmolality and hyponatremia: Secondary | ICD-10-CM | POA: Diagnosis present

## 2015-05-30 DIAGNOSIS — E860 Dehydration: Secondary | ICD-10-CM | POA: Diagnosis present

## 2015-05-30 DIAGNOSIS — D649 Anemia, unspecified: Secondary | ICD-10-CM | POA: Diagnosis present

## 2015-05-30 DIAGNOSIS — Z79899 Other long term (current) drug therapy: Secondary | ICD-10-CM

## 2015-05-30 DIAGNOSIS — E43 Unspecified severe protein-calorie malnutrition: Secondary | ICD-10-CM | POA: Diagnosis present

## 2015-05-30 DIAGNOSIS — K7010 Alcoholic hepatitis without ascites: Secondary | ICD-10-CM | POA: Diagnosis present

## 2015-05-30 DIAGNOSIS — E876 Hypokalemia: Secondary | ICD-10-CM

## 2015-05-30 DIAGNOSIS — Z888 Allergy status to other drugs, medicaments and biological substances status: Secondary | ICD-10-CM | POA: Diagnosis not present

## 2015-05-30 DIAGNOSIS — K7030 Alcoholic cirrhosis of liver without ascites: Secondary | ICD-10-CM | POA: Diagnosis present

## 2015-05-30 DIAGNOSIS — Z6820 Body mass index (BMI) 20.0-20.9, adult: Secondary | ICD-10-CM

## 2015-05-30 DIAGNOSIS — D689 Coagulation defect, unspecified: Secondary | ICD-10-CM | POA: Diagnosis present

## 2015-05-30 DIAGNOSIS — R17 Unspecified jaundice: Secondary | ICD-10-CM

## 2015-05-30 LAB — BASIC METABOLIC PANEL
Anion gap: 12 (ref 5–15)
BUN: 25 mg/dL — AB (ref 6–20)
CALCIUM: 8.6 mg/dL — AB (ref 8.9–10.3)
CO2: 29 mmol/L (ref 22–32)
Chloride: 82 mmol/L — ABNORMAL LOW (ref 101–111)
Creatinine, Ser: UNDETERMINED mg/dL (ref 0.44–1.00)
GLUCOSE: 133 mg/dL — AB (ref 65–99)
Potassium: 2.7 mmol/L — CL (ref 3.5–5.1)
SODIUM: 123 mmol/L — AB (ref 135–145)

## 2015-05-30 LAB — HEPATIC FUNCTION PANEL
ALK PHOS: 144 U/L — AB (ref 38–126)
ALT: 45 U/L (ref 14–54)
AST: 121 U/L — ABNORMAL HIGH (ref 15–41)
Albumin: 2.4 g/dL — ABNORMAL LOW (ref 3.5–5.0)
BILIRUBIN INDIRECT: 15.7 mg/dL — AB (ref 0.3–0.9)
Bilirubin, Direct: 8.4 mg/dL — ABNORMAL HIGH (ref 0.1–0.5)
TOTAL PROTEIN: 8.5 g/dL — AB (ref 6.5–8.1)
Total Bilirubin: 24.1 mg/dL (ref 0.3–1.2)

## 2015-05-30 LAB — TROPONIN I: Troponin I: <0.03 ng/mL (ref <0.031)

## 2015-05-30 LAB — CBC
HCT: 26.9 % — ABNORMAL LOW (ref 35.0–47.0)
Hemoglobin: 9.3 g/dL — ABNORMAL LOW (ref 12.0–16.0)
MCH: 33.6 pg (ref 26.0–34.0)
MCHC: 34.6 g/dL (ref 32.0–36.0)
MCV: 97.2 fL (ref 80.0–100.0)
PLATELETS: 134 10*3/uL — AB (ref 150–440)
RBC: 2.77 MIL/uL — ABNORMAL LOW (ref 3.80–5.20)
RDW: 16.4 % — AB (ref 11.5–14.5)
WBC: 13.5 10*3/uL — ABNORMAL HIGH (ref 3.6–11.0)

## 2015-05-30 LAB — APTT: APTT: 63 s — AB (ref 24–36)

## 2015-05-30 LAB — PROTIME-INR
INR: 3.26
PROTHROMBIN TIME: 32.6 s — AB (ref 11.4–15.0)

## 2015-05-30 LAB — GLUCOSE, CAPILLARY: GLUCOSE-CAPILLARY: 140 mg/dL — AB (ref 65–99)

## 2015-05-30 LAB — AMMONIA: AMMONIA: 48 umol/L — AB (ref 9–35)

## 2015-05-30 LAB — MAGNESIUM: Magnesium: 1 mg/dL — ABNORMAL LOW (ref 1.7–2.4)

## 2015-05-30 MED ORDER — PANTOPRAZOLE SODIUM 40 MG PO TBEC
40.0000 mg | DELAYED_RELEASE_TABLET | Freq: Every day | ORAL | Status: DC
  Administered 2015-05-30 – 2015-06-02 (×4): 40 mg via ORAL
  Filled 2015-05-30 (×4): qty 1

## 2015-05-30 MED ORDER — POTASSIUM CHLORIDE IN NACL 20-0.9 MEQ/L-% IV SOLN
INTRAVENOUS | Status: DC
  Administered 2015-05-30 – 2015-06-01 (×3): via INTRAVENOUS
  Filled 2015-05-30 (×5): qty 1000

## 2015-05-30 MED ORDER — LEVOTHYROXINE SODIUM 75 MCG PO TABS
75.0000 ug | ORAL_TABLET | Freq: Every day | ORAL | Status: DC
  Administered 2015-05-31 – 2015-06-02 (×3): 75 ug via ORAL
  Filled 2015-05-30 (×3): qty 1

## 2015-05-30 MED ORDER — ONDANSETRON HCL 4 MG PO TABS
4.0000 mg | ORAL_TABLET | Freq: Four times a day (QID) | ORAL | Status: DC | PRN
Start: 2015-05-30 — End: 2015-06-02

## 2015-05-30 MED ORDER — ACETAMINOPHEN 650 MG RE SUPP: 650.0000 mg | Freq: Four times a day (QID) | RECTAL | Status: DC | PRN

## 2015-05-30 MED ORDER — POTASSIUM CHLORIDE CRYS ER 20 MEQ PO TBCR
40.0000 meq | EXTENDED_RELEASE_TABLET | Freq: Once | ORAL | Status: AC
  Administered 2015-05-30: 40 meq via ORAL
  Filled 2015-05-30: qty 2

## 2015-05-30 MED ORDER — SODIUM CHLORIDE 0.9 % IV BOLUS (SEPSIS)
1000.0000 mL | Freq: Once | INTRAVENOUS | Status: AC
  Administered 2015-05-30: 1000 mL via INTRAVENOUS

## 2015-05-30 MED ORDER — ONDANSETRON HCL 4 MG/2ML IJ SOLN: 4.0000 mg | Freq: Four times a day (QID) | INTRAMUSCULAR | Status: DC | PRN

## 2015-05-30 MED ORDER — POTASSIUM CHLORIDE CRYS ER 20 MEQ PO TBCR
20.0000 meq | EXTENDED_RELEASE_TABLET | Freq: Two times a day (BID) | ORAL | Status: DC
  Administered 2015-05-31 – 2015-06-01 (×4): 20 meq via ORAL
  Filled 2015-05-30 (×4): qty 1

## 2015-05-30 MED ORDER — ACETAMINOPHEN 325 MG PO TABS: 650.0000 mg | ORAL_TABLET | Freq: Four times a day (QID) | ORAL | Status: DC | PRN

## 2015-05-30 MED ORDER — PREDNISONE 20 MG PO TABS
40.0000 mg | ORAL_TABLET | Freq: Every day | ORAL | Status: DC
  Administered 2015-05-31: 08:00:00 40 mg via ORAL
  Filled 2015-05-30: qty 2

## 2015-05-30 NOTE — ED Notes (Signed)
MD Sunani at bedside. 

## 2015-05-30 NOTE — ED Notes (Signed)
Sent over [facility] for abnormal lab work.  Potassium 2.8, INR 4.4.  Generalized weakness.  Seen here previously for a fall.

## 2015-05-30 NOTE — ED Notes (Signed)
Pt given lunch tray, verbal okay from MD gayle , pt sitting up in bed eating and tolerating well. No acute distress noted.

## 2015-05-30 NOTE — ED Notes (Signed)
RN attempted to call report, RN unable to take report. Charge RN unable to take report either. Will call this RN back in 5 mins. Charge RN Raquel made aware and verbalized understanding at this time.

## 2015-05-30 NOTE — ED Notes (Signed)
MD notified of critical serum potassium level at this time.

## 2015-05-30 NOTE — ED Provider Notes (Addendum)
[facility] Emergency Department Provider Note  ____________________________________________  Time seen: Approximately 3:06 PM  I have reviewed the triage vital signs and the nursing notes.   HISTORY  Chief Complaint Fatigue    HPI Anne Hickman is a 65 y.o. female with history of alcoholism, alcoholic cirrhosis with ascites, gallstones presents from her primary care doctor for evaluation of fatigue and multiple lab abnormalities, unknown onset, moderate, no modifying factors. Patient reports that for several weeks she has felt fatigued. She has also had increased yellowing of her skin. She denies any chest pain, difficulty breathing, vomiting, diarrhea, fevers or chills. No abdominal pain. No change in mental status. She denies any blood in her stools. She had her labs drawn yesterday and received a phone call today from primary care doctor that several labs was grossly abnormal and she needed to present to the emergency department for evaluation.    Past Medical History  Diagnosis Date  . Insomnia   . Generalized anxiety disorder   . Other and unspecified hyperlipidemia   . Unspecified hypothyroidism   . Alcohol abuse, episodic   . Eczema herpeticum     Patient Active Problem List   Diagnosis Date Noted  . Adult hypothyroidism 11/17/2014  . Polyclonal gammopathy determined by serum protein electrophoresis 11/17/2014  . ALC (alcoholic liver cirrhosis) ([place]) 09/22/2014  . Absolute anemia 09/22/2014  . Calculus of gallbladder 09/22/2014  . History of melena 09/22/2014  . Dizziness, nonspecific 07/09/2014  . Cervical adenopathy 07/09/2014  . Pain in joint, ankle and foot 10/07/2013  . Hepatitis, alcoholic, acute [id number]  . Hypomagnesemia 07/19/2013  . Alcohol abuse 02/11/2012  . GAD (generalized anxiety disorder) 02/11/2012  . Unspecified hypothyroidism 02/11/2012    Past Surgical History  Procedure Laterality Date  . Appendectomy  1978  .  Abdominal hysterectomy      Partial   . Esophagogastroduodenoscopy (egd) with propofol N/A 12/13/2014    Procedure: ESOPHAGOGASTRODUODENOSCOPY (EGD) WITH PROPOFOL;  Surgeon: Manya Silvas, MD;  Location: [place];  Service: Endoscopy;  Laterality: N/A;    Current Outpatient Rx  Name  Route  Sig  Dispense  Refill  . furosemide (LASIX) 40 MG tablet   Oral   Take 40 mg by mouth daily.         Marland Kitchen levothyroxine (SYNTHROID, LEVOTHROID) 75 MCG tablet   Oral   Take 75 mcg by mouth daily before breakfast.         . pantoprazole (PROTONIX) 40 MG tablet   Oral   Take 40 mg by mouth daily.         Marland Kitchen spironolactone (ALDACTONE) 50 MG tablet   Oral   Take 50 mg by mouth daily.         Marland Kitchen doxycycline (VIBRAMYCIN) 100 MG capsule   Oral   Take 1 capsule (100 mg total) by mouth 2 (two) times daily. Patient not taking: Reported on 05/30/2015   20 capsule   0   . ondansetron (ZOFRAN) 4 MG tablet   Oral   Take 1 tablet (4 mg total) by mouth every 8 (eight) hours as needed for nausea or vomiting. Patient not taking: Reported on 05/30/2015   20 tablet   0   . traMADol (ULTRAM) 50 MG tablet   Oral   Take 1 tablet (50 mg total) by mouth every 6 (six) hours as needed. Patient not taking: Reported on 05/30/2015   12 tablet   0     Allergies Bee  venom; Advil; Aleve; Betadine; and Codeine  Family History  Problem Relation Age of Onset  . Coronary artery disease Father   . Heart disease Father   . Breast cancer Maternal Aunt 60  . Mental illness Mother     Social History Social History  Substance Use Topics  . Smoking status: Never Smoker   . Smokeless tobacco: Never Used  . Alcohol Use: 12.6 oz/week    21 Glasses of wine per week     Comment: 1- 2 bottles of wine a day    Review of Systems Constitutional: No fever/chills Eyes: No visual changes. ENT: No sore throat. Cardiovascular: Denies chest pain. Respiratory: Denies shortness of breath. Gastrointestinal: No  abdominal pain.  No nausea, no vomiting.  No diarrhea.  No constipation. Genitourinary: Negative for dysuria. Musculoskeletal: Negative for back pain. Skin: Negative for rash. Neurological: Negative for headaches, focal weakness or numbness.  10-point ROS otherwise negative.  ____________________________________________   PHYSICAL EXAM:  VITAL SIGNS: ED Triage Vitals  Enc Vitals Group     BP 05/30/15 1418 99/56 mmHg     Pulse Rate 05/30/15 1418 89     Resp 05/30/15 1418 16     Temp 05/30/15 1418 98.2 F (36.8 C)     Temp Source 05/30/15 1418 Oral     SpO2 05/30/15 1418 99 %     Weight 05/30/15 1418 113 lb (51.256 kg)     Height 05/30/15 1418 5\' 4"  (1.626 m)     Head Cir --      Peak Flow --      Pain Score 05/30/15 1419 0     Pain Loc --      Pain Edu? --      Excl. in [place]? --     Constitutional: Alert and oriented. Fatigued but nontoxic-appearing and in no acute distress. Eyes: Conjunctivae are icteric. PERRL. EOMI. Head: Atraumatic. Nose: No congestion/rhinnorhea. Mouth/Throat: Mucous membranes are moist.  Oropharynx non-erythematous. Neck: No stridor.  Supple without meningismus. Cardiovascular: Normal rate, regular rhythm. Grossly normal heart sounds.  Good peripheral circulation. Respiratory: Normal respiratory effort.  No retractions. Diminished breath sounds bilateral bases. Gastrointestinal: Soft and nontender. No distention. No CVA tenderness. Genitourinary: deferred Musculoskeletal: No lower extremity tenderness nor edema.  No joint effusions. ** Neurologic:  Normal speech and language. No gross focal neurologic deficits are appreciated. No gait instability. Skin:  Skin is warm, dry and intact. Marked jaundice is noted. Psychiatric: Mood and affect are normal. Speech and behavior are normal.  ____________________________________________   LABS (all labs ordered are listed, but only abnormal results are displayed)  Labs Reviewed  BASIC METABOLIC PANEL -  Abnormal; Notable for the following:    Sodium 123 (*)    Potassium 2.7 (*)    Chloride 82 (*)    Glucose, Bld 133 (*)    BUN 25 (*)    Calcium 8.6 (*)    All other components within normal limits  CBC - Abnormal; Notable for the following:    WBC 13.5 (*)    RBC 2.77 (*)    Hemoglobin 9.3 (*)    HCT 26.9 (*)    RDW 16.4 (*)    Platelets 134 (*)    All other components within normal limits  GLUCOSE, CAPILLARY - Abnormal; Notable for the following:    Glucose-Capillary 140 (*)    All other components within normal limits  HEPATIC FUNCTION PANEL - Abnormal; Notable for the following:    Total Protein 8.5 (*)  Albumin 2.4 (*)    AST 121 (*)    Alkaline Phosphatase 144 (*)    Total Bilirubin 24.1 (*)    Bilirubin, Direct 8.4 (*)    Indirect Bilirubin 15.7 (*)    All other components within normal limits  AMMONIA - Abnormal; Notable for the following:    Ammonia 48 (*)    All other components within normal limits  TROPONIN I  URINALYSIS COMPLETEWITH MICROSCOPIC ([facility] ONLY)  PROTIME-INR  APTT  CBG MONITORING, ED   ____________________________________________  EKG  ED ECG REPORT I, Joanne Gavel, the attending physician, personally viewed and interpreted this ECG.   Date: 05/30/2015  EKG Time: 15:20  Rate: 84  Rhythm: normal sinus rhythm  Axis: normal  Intervals:none  ST&T Change: No acute ST elevation. No specific T wave flattening in 1, aVL, V4  ____________________________________________  RADIOLOGY  RUQ ultrasound IMPRESSION: Findings concerning for hepatic cirrhosis with minimal ascites.  No biliary dilatation is noted.  Mild gallbladder wall thickening is noted most likely due to adjacent hepatocellular disease. No cholelithiasis is noted.  CXR IMPRESSION: 1. Mild diffuse peribronchial cuffing suggestive of mild acute bronchitis.  ____________________________________________   PROCEDURES  Procedure(s) performed: None  Critical Care  performed: No  ____________________________________________   INITIAL IMPRESSION / ASSESSMENT AND PLAN / ED COURSE  Pertinent labs & imaging results that were available during my care of the patient were reviewed by me and considered in my medical decision making (see chart for details).  NAYSA DONIS is a 65 y.o. female with history of alcoholism, alcoholic cirrhosis with ascites, gallstones presents from her primary care doctor for evaluation of fatigue and multiple lab abnormalities. Exam, she appears fatigued but nontoxic. Mildly hypotensive with blood pressure 99/56 however this does not appear for her baseline, we'll give IV fluids. Otherwise, vital signs are stable, she is afebrile. On chart review, she was noted to have abnormal potassium, INR at [facility] clinic. We will recheck these labs and anticipate admission.  ----------------------------------------- 6:50 PM on 05/30/2015 -----------------------------------------  Labs reviewed and are notable for leukocytosis, chronic anemia which is stable, hemoglobin 9.3. AST is mildly elevated. T bili is significantly elevated with predominance of indirect bilirubin. Negative troponin. Ammonia elevated at 48 but she does not appear to have any altered mental status at this time. Ultrasound of gallbladder shows hepatic cirrhosis with minimal ascites, no biliary dilatation, no cholelithiasis, no pericholecystic fluid. PT INR and PTT still pending at the time of admission. Case discussed with Dr. Verdell Carmine, hospitalist, at this time.  ----------------------------------------- 8:27 PM on 05/30/2015 -----------------------------------------  Of note, the patient has a healing laceration in the left forearm which is closed with suture material, extremely friable, bleeds easily due to associated skin tear. No arterial bleeding. I replaced her dressing with Surgicel, Xeroform gauze, choban with good  hemostasis. ____________________________________________   FINAL CLINICAL IMPRESSION(S) / ED DIAGNOSES  Final diagnoses:  Hyponatremia  Hypokalemia  Jaundice  Hyperbilirubinemia      Joanne Gavel, MD 05/30/15 [id number]  Joanne Gavel, MD 05/30/15 2029

## 2015-05-30 NOTE — H&P (Signed)
[place] [facility] [place] NAME: Anne Hickman    MR#:  [id number]  DATE OF BIRTH:  [birth year]  DATE OF ADMISSION:  05/30/2015  PRIMARY CARE PHYSICIAN: Maryland Pink, MD   REQUESTING/REFERRING PHYSICIAN: Dr. Darrick Penna  CHIEF COMPLAINT:   Chief Complaint  Patient presents with  . Fatigue   jaundiced, electrolyte abnormalities.  HISTORY OF PRESENT ILLNESS:  Anne Hickman  is a 65 y.o. female with a known history of alcoholic liver cirrhosis, anxiety, alcohol abuse, hypothyroidism presented to the hospital referred from her gastroenterologist office due to fatigue and noted to have several electrolyte abnormalities and noted to be jaundiced. Patient has had a alcoholic drink just before New Year's and has not drank since then. Patient started to notice that she was getting jaundiced. Part of this year and went to see her gastroenterologist. They did blood work and he was noted to be abnormal with several electrolyte abnormalities and asked her to come to the ER for further evaluation. Patient presented to the emergency room was noted to be hypokalemic, hyponatremic and also noted to be severely jaundiced with significantly elevated bilirubin. She denies any abdominal pain, nausea, vomiting, hematemesis. She does have some easy bruising that her husband is noted. Given her left right abnormalities hospitalist services were contacted further treatment and evaluation.  PAST MEDICAL HISTORY:   Past Medical History  Diagnosis Date  . Insomnia   . Generalized anxiety disorder   . Other and unspecified hyperlipidemia   . Unspecified hypothyroidism   . Alcohol abuse, episodic   . Eczema herpeticum     PAST SURGICAL HISTORY:   Past Surgical History  Procedure Laterality Date  . Appendectomy  1978  . Abdominal hysterectomy      Partial   . Esophagogastroduodenoscopy (egd) with propofol N/A 12/13/2014    Procedure: ESOPHAGOGASTRODUODENOSCOPY  (EGD) WITH PROPOFOL;  Surgeon: Manya Silvas, MD;  Location: [place];  Service: Endoscopy;  Laterality: N/A;    SOCIAL HISTORY:   Social History  Substance Use Topics  . Smoking status: Never Smoker   . Smokeless tobacco: Never Used  . Alcohol Use: 12.6 oz/week    21 Glasses of wine per week     Comment: 1- 2 bottles of wine a day    FAMILY HISTORY:   Family History  Problem Relation Age of Onset  . Coronary artery disease Father   . Heart disease Father   . Breast cancer Maternal Aunt 60  . Mental illness Mother     DRUG ALLERGIES:   Allergies  Allergen Reactions  . Bee Venom Anaphylaxis  . Advil [Ibuprofen] Nausea And Vomiting  . Aleve [Naproxen Sodium] Nausea And Vomiting  . Betadine [Povidone Iodine] Other (See Comments)    Reaction:  Burning   . Codeine Other (See Comments)    Reaction:  GI upset     REVIEW OF SYSTEMS:   ROS  MEDICATIONS AT HOME:   Prior to Admission medications   Medication Sig Start Date End Date Taking? Authorizing Provider  furosemide (LASIX) 40 MG tablet Take 40 mg by mouth daily.   Yes Historical Provider, MD  levothyroxine (SYNTHROID, LEVOTHROID) 75 MCG tablet Take 75 mcg by mouth daily before breakfast.   Yes Historical Provider, MD  pantoprazole (PROTONIX) 40 MG tablet Take 40 mg by mouth daily.   Yes Historical Provider, MD  spironolactone (ALDACTONE) 50 MG tablet Take 50 mg by mouth daily.   Yes Historical Provider, MD  doxycycline (VIBRAMYCIN) 100 MG capsule Take 1 capsule (100 mg total) by mouth 2 (two) times daily. Patient not taking: Reported on 05/30/2015 05/20/15   Victorino Dike, FNP  ondansetron (ZOFRAN) 4 MG tablet Take 1 tablet (4 mg total) by mouth every 8 (eight) hours as needed for nausea or vomiting. Patient not taking: Reported on 05/30/2015 05/20/15   Victorino Dike, FNP  traMADol (ULTRAM) 50 MG tablet Take 1 tablet (50 mg total) by mouth every 6 (six) hours as needed. Patient not taking: Reported on  05/30/2015 05/20/15   Victorino Dike, FNP      VITAL SIGNS:  Blood pressure 95/50, pulse 81, temperature 98.2 F (36.8 C), temperature source Oral, resp. rate 16, height 5\' 4"  (1.626 m), weight 51.256 kg (113 lb), SpO2 99 %.  PHYSICAL EXAMINATION:  Physical Exam  GENERAL:  65 y.o.-year-old patient lying in the bed with no acute distress.  EYES: Pupils equal, round, reactive to light and accommodation. + scleral icterus. Extraocular muscles intact.  HEENT: Head atraumatic, normocephalic. Oropharynx and nasopharynx clear. No oropharyngeal erythema, moist oral mucosa  NECK:  Supple, no jugular venous distention. No thyroid enlargement, no tenderness.  LUNGS: Normal breath sounds bilaterally, no wheezing, rales, rhonchi. No use of accessory muscles of respiration.  CARDIOVASCULAR: S1, S2 RRR. No murmurs, rubs, gallops, clicks.  ABDOMEN: Soft, nontender, nondistended. Bowel sounds present. No organomegaly or mass. + spider angioma's.   EXTREMITIES: No pedal edema, cyanosis, or clubbing. + 2 pedal & radial pulses b/l.  Laceration on left forearm with dressing on it.    NEUROLOGIC: Cranial nerves II through XII are intact. No focal Motor or sensory deficits appreciated b/l PSYCHIATRIC: The patient is alert and oriented x 3. Good affect.  SKIN: No obvious rash, lesion, or ulcer.   LABORATORY PANEL:   CBC  Recent Labs Lab 05/30/15 1429  WBC 13.5*  HGB 9.3*  HCT 26.9*  PLT 134*   ------------------------------------------------------------------------------------------------------------------  Chemistries   Recent Labs Lab 05/30/15 1429  NA 123*  K 2.7*  CL 82*  CO2 29  GLUCOSE 133*  BUN 25*  CREATININE  UNABLE TO RESULT DUE TO ICTERIC INTERFERENCE  CALCIUM 8.6*  AST 121*  ALT 45  ALKPHOS 144*  BILITOT 24.1*   ------------------------------------------------------------------------------------------------------------------  Cardiac Enzymes  Recent Labs Lab  05/30/15 1429  TROPONINI <0.03   ------------------------------------------------------------------------------------------------------------------  RADIOLOGY:  Dg Chest Portable 1 View  05/30/2015  CLINICAL DATA:  65 year old female with history of weakness for the past few days. Jaundice. EXAM: PORTABLE CHEST 1 VIEW COMPARISON:  Chest x-ray 07/16/2011. FINDINGS: Mild diffuse peribronchial cuffing. Lung volumes are low. Linear opacities in the right lung base, compatible with mild scarring, similar to the prior examination. No consolidative airspace disease. No pleural effusions. No pneumothorax. No pulmonary nodule or mass noted. Pulmonary vasculature and the cardiomediastinal silhouette are within normal limits. IMPRESSION: 1. Mild diffuse peribronchial cuffing suggestive of mild acute bronchitis. Electronically Signed   By: Vinnie Langton M.D.   On: 05/30/2015 16:42   US Abdomen Limited Ruq  05/30/2015  CLINICAL DATA:  Jaundice. EXAM: US ABDOMEN LIMITED - RIGHT UPPER QUADRANT COMPARISON:  Ultrasound of February 20, 2015. FINDINGS: Gallbladder: No gallstones visualized. No sonographic Murphy sign noted by sonographer. Mild gallbladder wall thickening measuring 4.6 mm is noted. No definite pericholecystic fluid is noted. Common bile duct: Diameter: 4.4 mm which is within normal limits. Liver: No focal lesion identified. Coarse echogenicity is noted with nodular hepatic margins suggesting early hepatic cirrhosis.  Bidirectional flow is noted in the portal vein. Small amount of ascites is noted around the liver. IMPRESSION: Findings concerning for hepatic cirrhosis with minimal ascites. No biliary dilatation is noted. Mild gallbladder wall thickening is noted most likely due to adjacent hepatocellular disease. No cholelithiasis is noted. Electronically Signed   By: Marijo Conception, M.D.   On: 05/30/2015 18:27     IMPRESSION AND PLAN:   65 year old female with past medical history of alcoholic liver  cirrhosis, hypothyroidism, who presents to the hospital due to several electrolyte abnormalities and also noted to be jaundiced.  #1 hyponatremia-due to underlying chronic liver disease combined with dehydration. - I will gently hydrate her with IV fluids and follow sodium.   #2 Hypokalemia - due to dehydration and poor by mouth intake. -Continue supplemental potassium and repeat level in the morning. I will check a magnesium level.  #3 abnormal LFTs with elevated bilirubin-I suspect this is secondary to alcoholic hepatitis. Although patient drank almost a week to 10 days ago. Her abdominal ultrasound does not show any evidence of acute pathology other than chronic hepatic cirrhosis. - her discriminant Factor is elevated and I will start her on Prednisone.  - GI consult.   # 4 Hypothyroidism - cont. Synthroid.   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  - no acute bleeding and will follow counts.   #6 Coagulopathy - due to Liver Disease.  - follow PT/INR. No acute bleeding.     All the records are reviewed and case discussed with ED provider. Management plans discussed with the patient, family and they are in agreement.  CODE STATUS: Full  TOTAL TIME TAKING CARE OF THIS PATIENT: 45 minutes.    Henreitta Leber M.D on 05/30/2015 at 7:53 PM  Between 7am to 6pm - Pager - [phone]  After 6pm go to [email or web address] - password [email or web address] [facility]  [facility]  Office  [phone]  CC: Primary care physician; Maryland Pink, MD

## 2015-05-30 NOTE — Plan of Care (Signed)
Problem: Safety: Goal: Ability to remain free from injury will improve Outcome: Progressing Pt remains free of injury. Pt expresses knowledge to call for assistance when getting oob.

## 2015-05-30 NOTE — ED Notes (Signed)
Patient transported to Ultrasound 

## 2015-05-31 DIAGNOSIS — E43 Unspecified severe protein-calorie malnutrition: Secondary | ICD-10-CM | POA: Insufficient documentation

## 2015-05-31 LAB — URINALYSIS COMPLETE WITH MICROSCOPIC (ARMC ONLY)
Cellular Cast, UA: 39
GLUCOSE, UA: 50 mg/dL — AB
Ketones, ur: NEGATIVE mg/dL
Nitrite: NEGATIVE
Protein, ur: 30 mg/dL — AB
Renal Epithelial: 1
SPECIFIC GRAVITY, URINE: 1.014 (ref 1.005–1.030)
TRANS EPITHEL UA: 1
pH: 5 (ref 5.0–8.0)

## 2015-05-31 LAB — COMPREHENSIVE METABOLIC PANEL
ALK PHOS: 123 U/L (ref 38–126)
ALT: 35 U/L (ref 14–54)
ANION GAP: 7 (ref 5–15)
AST: 93 U/L — ABNORMAL HIGH (ref 15–41)
Albumin: 1.9 g/dL — ABNORMAL LOW (ref 3.5–5.0)
BILIRUBIN TOTAL: 21.2 mg/dL — AB (ref 0.3–1.2)
BUN: 28 mg/dL — ABNORMAL HIGH (ref 6–20)
CALCIUM: 7.8 mg/dL — AB (ref 8.9–10.3)
CO2: 26 mmol/L (ref 22–32)
Chloride: 90 mmol/L — ABNORMAL LOW (ref 101–111)
Creatinine, Ser: UNDETERMINED mg/dL (ref 0.44–1.00)
Glucose, Bld: 129 mg/dL — ABNORMAL HIGH (ref 65–99)
Potassium: 3.3 mmol/L — ABNORMAL LOW (ref 3.5–5.1)
SODIUM: 123 mmol/L — AB (ref 135–145)
TOTAL PROTEIN: 7.1 g/dL (ref 6.5–8.1)

## 2015-05-31 LAB — CBC
HEMATOCRIT: 23.5 % — AB (ref 35.0–47.0)
HEMOGLOBIN: 8.3 g/dL — AB (ref 12.0–16.0)
MCH: 35 pg — AB (ref 26.0–34.0)
MCHC: 35.3 g/dL (ref 32.0–36.0)
MCV: 99.1 fL (ref 80.0–100.0)
Platelets: 115 10*3/uL — ABNORMAL LOW (ref 150–440)
RBC: 2.37 MIL/uL — ABNORMAL LOW (ref 3.80–5.20)
RDW: 17 % — ABNORMAL HIGH (ref 11.5–14.5)
WBC: 10.5 10*3/uL (ref 3.6–11.0)

## 2015-05-31 LAB — MAGNESIUM: MAGNESIUM: 4.2 mg/dL — AB (ref 1.7–2.4)

## 2015-05-31 LAB — PROTIME-INR
INR: 3.56
PROTHROMBIN TIME: 34.8 s — AB (ref 11.4–15.0)

## 2015-05-31 LAB — POTASSIUM: POTASSIUM: 3.9 mmol/L (ref 3.5–5.1)

## 2015-05-31 LAB — PHOSPHORUS: PHOSPHORUS: UNDETERMINED mg/dL (ref 2.5–4.6)

## 2015-05-31 MED ORDER — TRAMADOL HCL 50 MG PO TABS: 50.0000 mg | ORAL_TABLET | Freq: Two times a day (BID) | ORAL | Status: DC | PRN

## 2015-05-31 MED ORDER — MAGNESIUM SULFATE 4 GM/100ML IV SOLN
4.0000 g | Freq: Once | INTRAVENOUS | Status: AC
  Administered 2015-05-31: 13:00:00 4 g via INTRAVENOUS
  Filled 2015-05-31: qty 100

## 2015-05-31 MED ORDER — CEPHALEXIN 500 MG PO CAPS
500.0000 mg | ORAL_CAPSULE | Freq: Two times a day (BID) | ORAL | Status: DC
  Administered 2015-05-31 – 2015-06-01 (×3): 500 mg via ORAL
  Filled 2015-05-31 (×3): qty 1

## 2015-05-31 MED ORDER — MAGNESIUM SULFATE 4 GM/100ML IV SOLN
4.0000 g | Freq: Once | INTRAVENOUS | Status: AC
  Administered 2015-05-31: 4 g via INTRAVENOUS
  Filled 2015-05-31: qty 100

## 2015-05-31 MED ORDER — MAGNESIUM OXIDE 400 (241.3 MG) MG PO TABS: 400.0000 mg | ORAL_TABLET | Freq: Every day | ORAL | Status: DC

## 2015-05-31 NOTE — Progress Notes (Signed)
MEDICATION RELATED CONSULT NOTE - INITIAL   Pharmacy Consult for electrolyte management/replacement Indication: hypokalemia, hypomagnesemia  Vital Signs: Temp: 97.9 F (36.6 C) (01/12 0531) Temp Source: Oral (01/12 0531) BP: 109/56 mmHg (01/12 0531) Pulse Rate: 82 (01/12 0531) Intake/Output from previous day: 01/11 0701 - 01/12 0700 In: -  Out: 300 [Urine:300] Intake/Output from this shift: Total I/O In: 240 [P.O.:240] Out: -   Labs:  Recent Labs  05/30/15 1429 05/31/15 0610  WBC 13.5* 10.5  HGB 9.3* 8.3*  HCT 26.9* 23.5*  PLT 134* 115*  APTT 63*  --   CREATININE  UNABLE TO RESULT DUE TO ICTERIC INTERFERENCE UNABLE TO REPORT DUE TO ICTERUS INTERFEARANCE  MG 1.0*  --   ALBUMIN 2.4* 1.9*  PROT 8.5* 7.1  AST 121* 93*  ALT 45 35  ALKPHOS 144* 123  BILITOT 24.1* 21.2*  BILIDIR 8.4*  --   IBILI 15.7*  --    CrCl cannot be calculated (Patient has no serum creatinine result on file.).  Assessment: Pharmacy consulted to manage and follow electrolytes in this 65 year old female admitted with fatigue/jaundice secondary to EtOH liver cirrhosis, and low electrolytes.   On admission 1/11, labs were as follows: Mg 1.0  K 2.7 Patient was started on NS w/ 20 mEq KCl at 75 mL/hr and KCl 20 mEq PO BID  BMP ordered 1/12 with AM labs - K improved but remained low at 3.3 Ordered phosphorus add-on but unable to determine due to icterus  Goal of Therapy:  Electrolytes within normal limits  Plan:  Gave Mg 4 g IV x 2 doses per protocol and start mag ox 400 mg daily 1/13 per MD Continue K-Dur 40 mEq PO BID for now K fluids still running  Mg and K level ordered for 1800 tonight. Pharmacy will continue to monitor, thank you for the consult.  Darylene Price Torre Pikus 05/31/2015,11:11 AM

## 2015-05-31 NOTE — Progress Notes (Signed)
Initial Nutrition Assessment  DOCUMENTATION CODES:   Severe malnutrition in context of chronic illness  INTERVENTION:   Meals and Snacks: Cater to patient preferences, will send am snack of chicken salad and crackers Medical Food Supplement Therapy: RD offered different supplements to which pt declined at this time, will reassess on follow pending intake   NUTRITION DIAGNOSIS:   Malnutrition related to chronic illness as evidenced by energy intake < or equal to 75% for > or equal to 1 month, percent weight loss.  GOAL:   Patient will meet greater than or equal to 90% of their needs  MONITOR:    (Energy Intake, Electrolyte and renal Profile, Anthropometrics, Digestive System, Hepatic Profile, Skin)  REASON FOR ASSESSMENT:   Malnutrition Screening Tool    ASSESSMENT:   Pt admitted with electrolyte abnormalities and jaundice with h/o alcoholic cirrhosis followed by gastroenterology.  Past Medical History  Diagnosis Date  . Insomnia   . Generalized anxiety disorder   . Other and unspecified hyperlipidemia   . Unspecified hypothyroidism   . Alcohol abuse, episodic   . Eczema herpeticum     Diet Order:  Diet 2 gram sodium Room service appropriate?: Yes; Fluid consistency:: Thin    Current Nutrition: Pt reports eating the most this morning at one meal than she has in months. Pt reports eating raisin bran with milk, and an orange. She had saved her yogurt for later to eat.   Food/Nutrition-Related History: Pt reports most mornings pt eats yogurt with protein powder added. Pt reports usually eating a smoothie later in the day with protein powder added. Pt reports poor po intake for months that she does 'not even want to see food,' with very little appetite. RD notes in MD notes, pt with h/o EtOH and a relapse for 3 weeks starting December 12th where pt was drinking wine.   Scheduled Medications:  . cephALEXin  500 mg Oral Q12H  . levothyroxine  75 mcg Oral QAC breakfast   . [START ON 06/01/2015] magnesium oxide  400 mg Oral Daily  . magnesium sulfate 1 - 4 g bolus IVPB  4 g Intravenous Once  . pantoprazole  40 mg Oral Daily  . potassium chloride  20 mEq Oral BID    Continuous Medications:  . 0.9 % NaCl with KCl 20 mEq / L 75 mL/hr at 05/31/15 1257     Electrolyte/Renal Profile and Glucose Profile:   Recent Labs Lab 05/30/15 1429 05/31/15 0610  NA 123* 123*  K 2.7* 3.3*  CL 82* 90*  CO2 29 26  BUN 25* 28*  CREATININE  UNABLE TO RESULT DUE TO ICTERIC INTERFERENCE UNABLE TO REPORT DUE TO ICTERUS INTERFEARANCE  CALCIUM 8.6* 7.8*  MG 1.0*  --   PHOS  --  UNABLE TO REPORT DUE TO ICTERIC INTERFERENCE  GLUCOSE 133* 129*   Protein Profile:  Recent Labs Lab 05/30/15 1429 05/31/15 0610  ALBUMIN 2.4* 1.9*   Hepatic Profile: Hepatic Function Latest Ref Rng 05/31/2015 05/30/2015 02/20/2015  Total Protein 6.5 - 8.1 g/dL 7.1 8.5(H) 8.2(H)  Albumin 3.5 - 5.0 g/dL 1.9(L) 2.4(L) 3.0(L)  AST 15 - 41 U/L 93(H) 121(H) 266(H)  ALT 14 - 54 U/L 35 45 48  Alk Phosphatase 38 - 126 U/L 123 144(H) 188(H)  Total Bilirubin 0.3 - 1.2 mg/dL 21.2(HH) 24.1(HH) 5.8(H)  Bilirubin, Direct 0.1 - 0.5 mg/dL - 8.4(H) -   Nutritional Anemia Profile:  CBC Latest Ref Rng 05/31/2015 05/30/2015 02/20/2015  WBC 3.6 - 11.0 K/uL 10.5  13.5(H) 8.2  Hemoglobin 12.0 - 16.0 g/dL 8.3(L) 9.3(L) 10.6(L)  Hematocrit 35.0 - 47.0 % 23.5(L) 26.9(L) 31.2(L)  Platelets 150 - 440 K/uL 115(L) 134(L) 79(L)     Gastrointestinal Profile: Last BM: 05/31/2015   Nutrition-Focused Physical Exam Findings:  Unable to complete Nutrition-Focused physical exam at this time.    Weight Change: Pt reports weight of 167lbs April 2016 (26% weight loss in 10 months) Per [facility] weight declining, noted 15% weight loss since July 2016 and 23% weight loss since February 2016, roughly 86month.     Height:   Ht Readings from Last 1 Encounters:  05/30/15 5' 4"  (1.626 m)    Weight:   Wt Readings from Last 1  Encounters:  05/30/15 122 lb 1.6 oz (55.384 kg)    Wt Readings from Last 10 Encounters:  05/30/15 122 lb 1.6 oz (55.384 kg)  05/27/15 138 lb (62.596 kg)  05/20/15 138 lb (62.596 kg)  02/19/15 130 lb (58.968 kg)  12/13/14 139 lb (63.05 kg)  11/17/14 143 lb 1.3 oz (64.9 kg)  10/12/14 144 lb 1.1 oz (65.35 kg)  07/06/14 158 lb 6.4 oz (71.85 kg)  02/28/14 163 lb 8 oz (74.163 kg)  12/06/13 160 lb (72.576 kg)    BMI:  Body mass index is 20.95 kg/(m^2).  Estimated Nutritional Needs:   Kcal:  BEE: 1099kcals, TEE: (IF 1.1-1.3)(AF 1.3)  1572-1857kcals  Protein:  60-72g protein (1.1-1.3g/kg)  Fluid:  1385-16656mof fluid (25-3039mg)  EDUCATION NEEDS:   Education needs no appropriate at this time    HIGOsgoodD, LDN Pager ([phone]/On-Call Pager ([phone]

## 2015-05-31 NOTE — Consult Note (Signed)
Pt with severe alcoholic cirrhosis and acute alcohol hepatitis from binge drinking of excessive amounts of wine.  Pt with severe hyponatremia also.  Pt INR improved some. Palpable left lobe of liver.  I agree with oral steroids for the alcohol hepatitis/cirrhosis.  We will follow with you.

## 2015-05-31 NOTE — Consult Note (Signed)
Consultation  Referring Provider: Dr. Theodoro Doing Primary Care Physician:  [place] Pink, MD Consulting  Gastroenterologist:   Dr. Vira Agar      Reason for Consultation: Cirrhosis          HPI:   Anne Hickman is a 65 y.o. female with a known history of alcoholic liver cirrhosis, anxiety, alcohol abuse, hypothyroidism presented to the hospital referred from our office yesterday for severe fatigue,  hyponatremia, hypokalemia, worsening hyperbilirubinemia, severe coagulopathy with extensive bruising, severe jaundice, suspected dehydration, and worsening renal function.    Her of the sample ammonia was elevated although she has not shown any overt signs of confusion or memory problems and does not have a diagnosis of encephalopathy.  She has not been on lactulose or Xifaxin. She has alcoholic hepatitis and has been placed on 40 mg prednisone appropriately. She is not a liver transplant candidate due to ongoing alcoholism. She has received IV fluids, electrolyte replacement, laboratory studies have shown improvement.  She does have leukocytosis, suspicious UA, negative chest x-ray.  She has enlarged liver with hardness on palpation worrisome for [facility]  that needs further imaging.  She went on a 3 week wine binge Dec 12. She was sober for 3 mos and "something set me off". Her last alcohol drink was Dec 30. She sees a Social worker and has not seen her lately, "because I was doing good." She drove herself to buy the wine in December so now her husband  has taken away the car keys. He said that she is cut off from all alcohol now and should remain sober. She comes in with her husband who is very quiet spoken and attentive. He voices frustration about her addiction.  No abd pain. Poor appetite. She had a fall and lacerated her left arm one week ago. She was seen in the ER and had  5 stitches because she fell onto a chair and slid down. This caused a raw spot and tore open an area.  Wt is down 20 lbs. Her husband had  stopped her diuretics for 2 weeks and recently placed her back on them. Currently, she is a little better. She has been hydrated and tolerating her diet.   Past Medical History  Diagnosis Date  . Insomnia   . Generalized anxiety disorder   . Other and unspecified hyperlipidemia   . Unspecified hypothyroidism   . Alcohol abuse, episodic   . Eczema herpeticum     Past Surgical History  Procedure Laterality Date  . Appendectomy  1978  . Abdominal hysterectomy      Partial   . Esophagogastroduodenoscopy (egd) with propofol N/A 12/13/2014    Procedure: ESOPHAGOGASTRODUODENOSCOPY (EGD) WITH PROPOFOL;  Surgeon: Manya Silvas, MD;  Location: [facility];  Service: Endoscopy;  Laterality: N/A;    Family History  Problem Relation Age of Onset  . Coronary artery disease Father   . Heart disease Father   . Breast cancer Maternal Aunt 60  . Mental illness Mother      Social History  Substance Use Topics  . Smoking status: Never Smoker   . Smokeless tobacco: Never Used  . Alcohol Use: 12.6 oz/week    21 Glasses of wine per week     Comment: 1- 2 bottles of wine a day    Prior to Admission medications   Medication Sig Start Date End Date Taking? Authorizing Provider  furosemide (LASIX) 40 MG tablet Take 40 mg by mouth daily.   Yes Historical Provider, MD  levothyroxine (SYNTHROID, LEVOTHROID) 75 MCG tablet Take 75 mcg by mouth daily before breakfast.   Yes Historical Provider, MD  pantoprazole (PROTONIX) 40 MG tablet Take 40 mg by mouth daily.   Yes Historical Provider, MD  spironolactone (ALDACTONE) 50 MG tablet Take 50 mg by mouth daily.   Yes Historical Provider, MD  doxycycline (VIBRAMYCIN) 100 MG capsule Take 1 capsule (100 mg total) by mouth 2 (two) times daily. Patient not taking: Reported on 05/30/2015 05/20/15   Victorino Dike, FNP  ondansetron (ZOFRAN) 4 MG tablet Take 1 tablet (4 mg total) by mouth every 8 (eight) hours as needed for nausea or vomiting. Patient not  taking: Reported on 05/30/2015 05/20/15   Victorino Dike, FNP  traMADol (ULTRAM) 50 MG tablet Take 1 tablet (50 mg total) by mouth every 6 (six) hours as needed. Patient not taking: Reported on 05/30/2015 05/20/15   Victorino Dike, FNP    Current Facility-Administered Medications  Medication Dose Route Frequency Provider Last Rate Last Dose  . 0.9 % NaCl with KCl 20 mEq/ L  infusion   Intravenous Continuous Henreitta Leber, MD 75 mL/hr at 05/30/15 2259    . cephALEXin (KEFLEX) capsule 500 mg  500 mg Oral Q12H Fritzi Mandes, MD   500 mg at 05/31/15 1044  . levothyroxine (SYNTHROID, LEVOTHROID) tablet 75 mcg  75 mcg Oral QAC breakfast Henreitta Leber, MD   75 mcg at 05/31/15 0756  . [START ON 06/01/2015] magnesium oxide (MAG-OX) tablet 400 mg  400 mg Oral Daily Fritzi Mandes, MD      . magnesium sulfate IVPB 4 g 100 mL  4 g Intravenous Once Lenis Noon, RPH   4 g at 05/31/15 1044  . magnesium sulfate IVPB 4 g 100 mL  4 g Intravenous Once Lenis Noon, RPH      . ondansetron [facility]) tablet 4 mg  4 mg Oral Q6H PRN Henreitta Leber, MD       Or  . ondansetron (ZOFRAN) injection 4 mg  4 mg Intravenous Q6H PRN Henreitta Leber, MD      . pantoprazole (PROTONIX) EC tablet 40 mg  40 mg Oral Daily Henreitta Leber, MD   40 mg at 05/31/15 1044  . potassium chloride SA (K-DUR,KLOR-CON) CR tablet 20 mEq  20 mEq Oral BID Henreitta Leber, MD   20 mEq at 05/31/15 1044  . traMADol (ULTRAM) tablet 50 mg  50 mg Oral Q12H PRN Fritzi Mandes, MD        Allergies as of 05/30/2015 - Review Complete 05/30/2015  Allergen Reaction Noted  . Bee venom Anaphylaxis 10/12/2014  . Advil [ibuprofen] Nausea And Vomiting 09/28/2014  . Aleve [naproxen sodium] Nausea And Vomiting 10/12/2014  . Betadine [povidone iodine] Other (See Comments) 06/17/2011  . Codeine Other (See Comments) 02/11/2012     Review of Systems:    A 12 system review was obtained and all negative except where noted in HPI. Left arm sutures- wound covered not  examined.    Physical Exam:  Vital signs in last 24 hours: Temp:  [97.7 F (36.5 C)-98.2 F (36.8 C)] 97.9 F (36.6 C) (01/12 0531) Pulse Rate:  [80-89] 82 (01/12 0531) Resp:  [16-20] 17 (01/12 0531) BP: (88-109)/(43-56) 109/56 mmHg (01/12 0531) SpO2:  [98 %-100 %] 99 % (01/12 0531) Weight:  [51.256 kg (113 lb)-55.384 kg (122 lb 1.6 oz)] 55.384 kg (122 lb 1.6 oz) (01/11 2132) Last BM Date: 05/30/15 (per pt)  General:  Thin, chronically ill looking with florid jaundice Head:  Head without obvious abnormality, atraumatic  Eyes:   Conjunctiva pink, sclera floridly icteric ENT:   Mouth free of lesions, mucosa moist, tongue pink, no thrush noted, teeth and gums normal Neck:   Supple w/o thyromegaly or mass, trachea midline, no adenopathy  Lungs: Clear to auscultation bilaterally, respirations unlabored Heart:     Normal S1S2, no rubs, positive left sternal border 4/6 murmur - not new Abdomen: Soft, non-tender, no ascites, hard area epigastric- enlarged liver-concern about hidden mass not showing on [place].  Rectal: Deferred Lymph:  No cervical or supraclavicular adenopathy. Extremities:   No edema, cyanosis, or clubbing Skin  Skin color jaundiced, multiple telangiectasias, pedal edema from yest is gone,  Multiple ecchymoses with hematomas right upper and  arm left forearm and dressing over the left antecubital forearm which was not examined Neuro:  A&O x 3. CNII-XII intact, memory intact Psych:  Appropriate mood and affect voicing desire to stop drinking. She promises to not do this relapse again. Denial is present. She was told to get back into ETOH counseling yesterday and to never stop counseling.   Data Reviewed:  LAB RESULTS:  Recent Labs  05/30/15 1429 05/31/15 0610  WBC 13.5* 10.5  HGB 9.3* 8.3*  HCT 26.9* 23.5*  PLT 134* 115*   BMET  Recent Labs  05/30/15 1429 05/31/15 0610  NA 123* 123*  K 2.7* 3.3*  CL 82* 90*  CO2 29 26  GLUCOSE 133* 129*  BUN 25* 28*   CREATININE  UNABLE TO RESULT DUE TO ICTERIC INTERFERENCE UNABLE TO REPORT DUE TO ICTERUS INTERFEARANCE  CALCIUM 8.6* 7.8*   LFT  Recent Labs  05/30/15 1429 05/31/15 0610  PROT 8.5* 7.1  ALBUMIN 2.4* 1.9*  AST 121* 93*  ALT 45 35  ALKPHOS 144* 123  BILITOT 24.1* 21.2*  BILIDIR 8.4*  --   IBILI 15.7*  --    PT/INR  Recent Labs  05/30/15 1429 05/31/15 0610  LABPROT 32.6* 34.8*  INR 3.26 3.56    STUDIES: Dg Chest Portable 1 View  05/30/2015  CLINICAL DATA:  65 year old female with history of weakness for the past few days. Jaundice. EXAM: PORTABLE CHEST 1 VIEW COMPARISON:  Chest x-ray 07/16/2011. FINDINGS: Mild diffuse peribronchial cuffing. Lung volumes are low. Linear opacities in the right lung base, compatible with mild scarring, similar to the prior examination. No consolidative airspace disease. No pleural effusions. No pneumothorax. No pulmonary nodule or mass noted. Pulmonary vasculature and the cardiomediastinal silhouette are within normal limits. IMPRESSION: 1. Mild diffuse peribronchial cuffing suggestive of mild acute bronchitis. Electronically Signed   By: Vinnie Langton M.D.   On: 05/30/2015 16:42   US Abdomen Limited Ruq  05/30/2015  CLINICAL DATA:  Jaundice. EXAM: US ABDOMEN LIMITED - RIGHT UPPER QUADRANT COMPARISON:  Ultrasound of February 20, 2015. FINDINGS: Gallbladder: No gallstones visualized. No sonographic Murphy sign noted by sonographer. Mild gallbladder wall thickening measuring 4.6 mm is noted. No definite pericholecystic fluid is noted. Common bile duct: Diameter: 4.4 mm which is within normal limits. Liver: No focal lesion identified. Coarse echogenicity is noted with nodular hepatic margins suggesting early hepatic cirrhosis. Bidirectional flow is noted in the portal vein. Small amount of ascites is noted around the liver. IMPRESSION: Findings concerning for hepatic cirrhosis with minimal ascites. No biliary dilatation is noted. Mild gallbladder wall  thickening is noted most likely due to adjacent hepatocellular disease. No cholelithiasis is noted. Electronically Signed  By: Marijo Conception, M.D.   On: 05/30/2015 18:27     Assessment:  Anne Hickman is a 65 y.o. with a known history of alcoholic liver cirrhosis, anxiety, alcohol abuse, hypothyroidism presented to the hospital referred from our GI office yesterday for severe fatigue, hyponatremia, hypokalemia, worsening hyperbilirubinemia, coagulopathy, severe jaundice, suspected dehydration, and worsening renal function.  Plan:    Continue with supportive therapy with hydration, electrolyte replacement, and prednisone for alcoholic cirrhosis.  She has been seen by a nutritionist for consult and has been tolerating her diet.  She has a hardness in the epigastric area secondary to the liver that needs further imaging.  Will need to consider MRI a given her renal status.  She is on Keflex for UTI.   She continues to demonstrate denial  regarding the severity of her alcoholism and the need for chronic counseling.   Her husband has increased caregiver role at the household and voices frustration.  There is an outpatient referral to [facility] alcoholic liver specialist Dr.Bataller in process.  She reports she has a counselor that she likes to see and she was advised to continue with counseling on a regular basis and never discontinue.  This case was discussed with Dr. Manya Silvas in collaboration of care. Thank you for the consultation.  These services provided by Denice Paradise RN, MSN, ANP-BC under collaborative practice agreement with Manya Silvas, MD.  05/31/2015, 12:23 PM

## 2015-05-31 NOTE — Progress Notes (Signed)
[place] [facility] [place] NAME: Amos Frericks    MR#:  [id number]  DATE OF BIRTH:  02-Dec-1950  SUBJECTIVE:  I have not felt better in so many days. Feel better today. Pt has had lot of questions about her liver-tried to answer most of them  REVIEW OF SYSTEMS:   Review of Systems  Constitutional: Positive for malaise/fatigue. Negative for fever, chills and weight loss.  HENT: Negative for ear discharge, ear pain and nosebleeds.   Eyes: Negative for blurred vision, pain and discharge.  Respiratory: Negative for sputum production, shortness of breath, wheezing and stridor.   Cardiovascular: Negative for chest pain, palpitations, orthopnea and PND.  Gastrointestinal: Negative for nausea, vomiting, abdominal pain and diarrhea.  Genitourinary: Negative for urgency and frequency.  Musculoskeletal: Positive for myalgias and falls. Negative for back pain and joint pain.  Skin:       jaundice)  Neurological: Positive for weakness. Negative for sensory change, speech change and focal weakness.  Psychiatric/Behavioral: Negative for depression and hallucinations. The patient is not nervous/anxious.   All other systems reviewed and are negative.  Tolerating Diet:yes Tolerating PT: pending  DRUG ALLERGIES:   Allergies  Allergen Reactions  . Bee Venom Anaphylaxis  . Advil [Ibuprofen] Nausea And Vomiting  . Aleve [Naproxen Sodium] Nausea And Vomiting  . Betadine [Povidone Iodine] Other (See Comments)    Reaction:  Burning   . Codeine Other (See Comments)    Reaction:  GI upset     VITALS:  Blood pressure 109/56, pulse 82, temperature 97.9 F (36.6 C), temperature source Oral, resp. rate 17, height 5\' 4"  (1.626 m), weight 55.384 kg (122 lb 1.6 oz), SpO2 99 %.  PHYSICAL EXAMINATION:   Physical Exam  GENERAL:  65 y.o.-year-old patient lying in the bed with no acute distress. wweak EYES: Pupils equal, round, reactive to light and  accommodation.++ scleral icterus. Extraocular muscles intact.  HEENT: Head atraumatic, normocephalic. Oropharynx and nasopharynx clear.  NECK:  Supple, no jugular venous distention. No thyroid enlargement, no tenderness.  LUNGS: Normal breath sounds bilaterally, no wheezing, rales, rhonchi. No use of accessory muscles of respiration.  CARDIOVASCULAR: S1, S2 normal. No murmurs, rubs, or gallops.  ABDOMEN: Soft, nontender, nondistended. Bowel sounds present. No organomegaly or mass.  EXTREMITIES: No cyanosis, clubbing or edema b/l.   Left elbow dressing NEUROLOGIC: Cranial nerves II through XII are intact. No focal Motor or sensory deficits b/l.   PSYCHIATRIC:  patient is alert and oriented x 3.  SKIN: No obvious rash, lesion, or ulcer. bruises+  LABORATORY PANEL:  CBC  Recent Labs Lab 05/31/15 0610  WBC 10.5  HGB 8.3*  HCT 23.5*  PLT 115*    Chemistries   Recent Labs Lab 05/30/15 1429 05/31/15 0610  NA 123* 123*  K 2.7* 3.3*  CL 82* 90*  CO2 29 26  GLUCOSE 133* 129*  BUN 25* 28*  CREATININE  UNABLE TO RESULT DUE TO ICTERIC INTERFERENCE UNABLE TO REPORT DUE TO ICTERUS INTERFEARANCE  CALCIUM 8.6* 7.8*  MG 1.0*  --   AST 121* 93*  ALT 45 35  ALKPHOS 144* 123  BILITOT 24.1* 21.2*    Cardiac Enzymes  Recent Labs Lab 05/30/15 1429  TROPONINI <0.03   RADIOLOGY:  Dg Chest Portable 1 View  05/30/2015  CLINICAL DATA:  65 year old female with history of weakness for the past few days. Jaundice. EXAM: PORTABLE CHEST 1 VIEW COMPARISON:  Chest x-ray 07/16/2011. FINDINGS: Mild diffuse peribronchial cuffing.  Lung volumes are low. Linear opacities in the right lung base, compatible with mild scarring, similar to the prior examination. No consolidative airspace disease. No pleural effusions. No pneumothorax. No pulmonary nodule or mass noted. Pulmonary vasculature and the cardiomediastinal silhouette are within normal limits. IMPRESSION: 1. Mild diffuse peribronchial cuffing  suggestive of mild acute bronchitis. Electronically Signed   By: Vinnie Langton M.D.   On: 05/30/2015 16:42   US Abdomen Limited Ruq  05/30/2015  CLINICAL DATA:  Jaundice. EXAM: US ABDOMEN LIMITED - RIGHT UPPER QUADRANT COMPARISON:  Ultrasound of February 20, 2015. FINDINGS: Gallbladder: No gallstones visualized. No sonographic Murphy sign noted by sonographer. Mild gallbladder wall thickening measuring 4.6 mm is noted. No definite pericholecystic fluid is noted. Common bile duct: Diameter: 4.4 mm which is within normal limits. Liver: No focal lesion identified. Coarse echogenicity is noted with nodular hepatic margins suggesting early hepatic cirrhosis. Bidirectional flow is noted in the portal vein. Small amount of ascites is noted around the liver. IMPRESSION: Findings concerning for hepatic cirrhosis with minimal ascites. No biliary dilatation is noted. Mild gallbladder wall thickening is noted most likely due to adjacent hepatocellular disease. No cholelithiasis is noted. Electronically Signed   By: Marijo Conception, M.D.   On: 05/30/2015 18:27   ASSESSMENT AND PLAN:  65 year old female with past medical history of alcoholic liver cirrhosis, hypothyroidism, who presents to the hospital due to several electrolyte abnormalities and also noted to be jaundiced.  #1 hyponatremia-due to underlying chronic liver disease combined with dehydration. - will gently hydrate her with IV fluids and follow sodium.  [id number]  #2 Hypokalemia/hypomagnesimia due to Alcohol abuse - due to dehydration and poor by mouth intake. -Continue supplemental potassium and magnesium  #3 Alcoholic liver cirrhosis as seen on USG abd -with severe coagulopathy,low platelets - GI consult pending  # 4 Hypothyroidism - cont. Synthroid.   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  - no acute bleeding and will follow counts.   #6 Coagulopathy - due to Liver Disease.  - follow PT/INR. No acute bleeding.    #7 UTI  Keflex po bid  Case discussed with Care Management/Social Worker. Management plans discussed with the patient, family and they are in agreement.  CODE STATUS: full  DVT Prophylaxis: teds. Pt;s INR 3.6  TOTAL TIME TAKING CARE OF THIS PATIENT: 35 minutes.  >50% time spent on counselling and coordination of care  POSSIBLE D/C IN *1-2 DAYS, DEPENDING ON CLINICAL CONDITION.  Note: This dictation was prepared with Dragon dictation along with smaller phrase technology. Any transcriptional errors that result from this process are unintentional.  Marializ Ferrebee M.D on 05/31/2015 at 11:35 AM  Between 7am to 6pm - Pager - [phone]  After 6pm go to [email or web address] - password [email or web address] [facility]  [facility]  Office  [phone]  CC: Primary care physician; [place] Pink, MD

## 2015-06-01 LAB — MAGNESIUM: MAGNESIUM: 3.3 mg/dL — AB (ref 1.7–2.4)

## 2015-06-01 LAB — SODIUM
Sodium: 123 mmol/L — ABNORMAL LOW (ref 135–145)
Sodium: 124 mmol/L — ABNORMAL LOW (ref 135–145)

## 2015-06-01 LAB — POTASSIUM: Potassium: 3.9 mmol/L (ref 3.5–5.1)

## 2015-06-01 MED ORDER — MAGNESIUM OXIDE 400 (241.3 MG) MG PO TABS
400.0000 mg | ORAL_TABLET | Freq: Two times a day (BID) | ORAL | Status: DC
  Administered 2015-06-01 (×2): 400 mg via ORAL
  Filled 2015-06-01 (×2): qty 1

## 2015-06-01 MED ORDER — SODIUM CHLORIDE 0.9 % IV SOLN
INTRAVENOUS | Status: AC
  Administered 2015-06-01 (×2): via INTRAVENOUS

## 2015-06-01 MED ORDER — PREDNISONE 20 MG PO TABS
40.0000 mg | ORAL_TABLET | Freq: Every day | ORAL | Status: DC
  Administered 2015-06-02: 08:00:00 40 mg via ORAL
  Filled 2015-06-01: qty 2

## 2015-06-01 NOTE — Progress Notes (Signed)
Na+ result in. Attempt to page Dr Posey Pronto. After her call hours. No response. Will convey level in AM

## 2015-06-01 NOTE — Evaluation (Signed)
Physical Therapy Evaluation Patient Details Name: Anne Hickman MRN: [id number] DOB: 1951-03-30 Today's Date: 06/01/2015   History of Present Illness  Anne Hickman is a 65 y.o. female with a known history of alcoholic liver cirrhosis, anxiety, alcohol abuse, hypothyroidism presented to the hospital referred from her gastroenterologist office due to fatigue and noted to have several electrolyte abnormalities and noted to be jaundiced.  Clinical Impression  Patient is 65 yr old female with generalized weakness in BLE and poor strength in R ankle DF. She has impaired dynamic standing balance and is unable to tandem stand or single leg stand. She ambulates holding onto the IV pole and CGA 300 feet. She is CGA for transfers and is independent for bed mobility. She will benefit from skilled PT to return to PLOF.     Follow Up Recommendations Home health PT    Equipment Recommendations       Recommendations for Other Services       Precautions / Restrictions        Mobility  Bed Mobility Overal bed mobility: Independent                Transfers Overall transfer level: Independent                  Ambulation/Gait Ambulation/Gait assistance: Modified independent (Device/Increase time) Ambulation Distance (Feet): 300 Feet Assistive device:  (IV pole) Gait Pattern/deviations: Steppage   Gait velocity interpretation: <1.8 ft/sec, indicative of risk for recurrent falls General Gait Details:  (R hip flex during swing RLE)  Stairs            Wheelchair Mobility    Modified Rankin (Stroke Patients Only)       Balance Overall balance assessment: Needs assistance Sitting-balance support: Single extremity supported Sitting balance-Leahy Scale: Normal     Standing balance support: Single extremity supported Standing balance-Leahy Scale: Fair   Single Leg Stance - Right Leg:  (unable) Single Leg Stance - Left Leg:  (unable) Tandem Stance - Right Leg:   (unable) Tandem Stance - Left Leg:  (unable)                     Pertinent Vitals/Pain Pain Assessment: No/denies pain    Home Living Family/patient expects to be discharged to:: Private residence Living Arrangements: Spouse/significant other Available Help at Discharge: Family Type of Home: House Home Access: Stairs to enter Entrance Stairs-Rails: None Technical brewer of Steps: 2 Home Layout: Two level Home Equipment: None      Prior Function Level of Independence: Independent               Hand Dominance        Extremity/Trunk Assessment   Upper Extremity Assessment: Overall WFL for tasks assessed           Lower Extremity Assessment: Overall WFL for tasks assessed         Communication   Communication: No difficulties  Cognition Arousal/Alertness: Awake/alert Behavior During Therapy: WFL for tasks assessed/performed Overall Cognitive Status: Within Functional Limits for tasks assessed                      General Comments      Exercises General Exercises - Lower Extremity Ankle Circles/Pumps: AROM;Strengthening;Standing;Seated Long Arc Quad: 20 reps Hip ABduction/ADduction: Standing;20 reps;Strengthening Straight Leg Raises: Standing;Strengthening;Right;Left;20 reps Hip Flexion/Marching: 20 reps;Right;Left;Strengthening;Standing Toe Raises: 20 reps;Strengthening;Standing Mini-Sqauts: 10 reps;Strengthening;Standing      Assessment/Plan    PT Assessment Patient  needs continued PT services  PT Diagnosis Generalized weakness;Difficulty walking   PT Problem List Decreased strength;Decreased balance;Decreased mobility  PT Treatment Interventions Gait training;Therapeutic exercise;Patient/family education   PT Goals (Current goals can be found in the Care Plan section) Acute Rehab PT Goals Patient Stated Goal: to go home PT Goal Formulation: With patient Time For Goal Achievement: 06/12/15 Potential to Achieve Goals:  Good    Frequency Min 2X/week   Barriers to discharge        Co-evaluation               End of Session Equipment Utilized During Treatment: Gait belt Activity Tolerance: Patient tolerated treatment well Patient left: in chair           Time: 1445-1525 PT Time Calculation (min) (ACUTE ONLY): 40 min   Charges:   PT Evaluation $PT Eval Moderate Complexity: 1 Procedure PT Treatments $Gait Training: 8-22 mins $Therapeutic Exercise: 8-22 mins   PT G Codes:       Alanson Puls, PT, DPT [place], Minette [place]land S 06/01/2015, 4:08 PM

## 2015-06-01 NOTE — Consult Note (Signed)
Subjective: "I'm feeling so much better." No abdominal pain.    Objective: Vital signs in last 24 hours:  Filed Vitals:   05/31/15 1252 05/31/15 2145 06/01/15 0438 06/01/15 1336  BP: 100/46 116/66 102/59 113/65  Pulse: 80 74 80 77  Temp: 97.9 F (36.6 C) 97.4 F (36.3 C) 97.4 F (36.3 C) 97.6 F (36.4 C)  TempSrc: Oral Oral Oral Oral  Resp: 20 20 18 18   Height:      Weight:      SpO2: 98% 100% 100% 100%    Weight change:    Intake/Output Summary (Last 24 hours) at 06/01/15 1500 Last data filed at 06/01/15 1200  Gross per 24 hour  Intake    555 ml  Output    350 ml  Net    205 ml    General: Thin, chronically ill looking with jaundice. Sitting in chair. Pleasant and conversing.  Eyes: Conjunctiva pink, sclera floridly icteric ENT: Mouth free of lesions, mucosa moist, tongue pink, no thrush noted, teeth and gums normal Lungs:Clear to auscultation bilaterally with very few basilar crackles, respirations unlabored Heart: Normal S1S2, no rubs, positive left sternal border 2/6 murmur - not new Abdomen:Soft, non-tender, no ascites, hard area epigastric- enlarged liver lobe  Rectal:Deferred Lymph: No cervical or supraclavicular adenopathy. Extremities: No edema SkinSkin color jaundiced, multiple telangiectasias, pedal edema from yest is gone, Multiple ecchymoses with hematomas right upper and arm left forearm improving. Sutures out left arm.  Neuro: A&O x 3. CNII-XII intact, memory intact Psych: Appropriate mood and affect.    ROS: Positive weakness, jaundice, bruising,  otherwise 10 system review negative   Lab Results: K 3.9, Na 123  Studies/Results:  Dg Chest Portable 1 View  05/30/2015  CLINICAL DATA:  65 year old female with history of weakness for the past few days. Jaundice. EXAM: PORTABLE CHEST 1 VIEW COMPARISON:  Chest x-ray  07/16/2011. FINDINGS: Mild diffuse peribronchial cuffing. Lung volumes are low. Linear opacities in the right lung base, compatible with mild scarring, similar to the prior examination. No consolidative airspace disease. No pleural effusions. No pneumothorax. No pulmonary nodule or mass noted. Pulmonary vasculature and the cardiomediastinal silhouette are within normal limits. IMPRESSION: 1. Mild diffuse peribronchial cuffing suggestive of mild acute bronchitis. Electronically Signed   By: Vinnie Langton M.D.   On: 05/30/2015 16:42   US Abdomen Limited Ruq  05/30/2015  CLINICAL DATA:  Jaundice. EXAM: US ABDOMEN LIMITED - RIGHT UPPER QUADRANT COMPARISON:  Ultrasound of February 20, 2015. FINDINGS: Gallbladder: No gallstones visualized. No sonographic Murphy sign noted by sonographer. Mild gallbladder wall thickening measuring 4.6 mm is noted. No definite pericholecystic fluid is noted. Common bile duct: Diameter: 4.4 mm which is within normal limits. Liver: No focal lesion identified. Coarse echogenicity is noted with nodular hepatic margins suggesting early hepatic cirrhosis. Bidirectional flow is noted in the portal vein. Small amount of ascites is noted around the liver. IMPRESSION: Findings concerning for hepatic cirrhosis with minimal ascites. No biliary dilatation is noted. Mild gallbladder wall thickening is noted most likely due to adjacent hepatocellular disease. No cholelithiasis is noted. Electronically Signed   By: Marijo Conception, M.D.   On: 05/30/2015 18:27    Scheduled Meds: . levothyroxine  75 mcg Oral QAC breakfast  . magnesium oxide  400 mg Oral BID  . pantoprazole  40 mg Oral Daily  . potassium chloride  20 mEq Oral BID  . [START ON 06/02/2015] predniSONE  40 mg Oral Q breakfast   Continuous Infusions: .  sodium chloride 100 mL/hr at 06/01/15 1148   PRN Meds:.ondansetron **OR** ondansetron (ZOFRAN) IV, traMADol  Assessment/Plan:  Active Problems:   Alcoholic hepatitis    Protein-calorie malnutrition, severe Hyponatremia- now on fluid restriction.  Hypokalemia-supplemented  Coagulopathy with anemia stable- bruising decreasing-no active bldg.   Continue with treatment plan. Await AFP. No recommendations for CT or MRI imagine yet. This case was discussed with Dr. Vira Agar in collaboration of care.    LOS: 2 days   Marval Regal, NP 06/01/2015, 3:00 PM

## 2015-06-01 NOTE — Progress Notes (Signed)
[facility] [place] [facility] NAME: Anne Hickman    MR#:  [id number]  DATE OF BIRTH:  Oct 12, 1950  SUBJECTIVE:  I have not felt better in so many days. Feel better today. Out in the chair  REVIEW OF SYSTEMS:   Review of Systems  Constitutional: Positive for malaise/fatigue. Negative for fever, chills and weight loss.  HENT: Negative for ear discharge, ear pain and nosebleeds.   Eyes: Negative for blurred vision, pain and discharge.  Respiratory: Negative for sputum production, shortness of breath, wheezing and stridor.   Cardiovascular: Negative for chest pain, palpitations, orthopnea and PND.  Gastrointestinal: Negative for nausea, vomiting, abdominal pain and diarrhea.  Genitourinary: Negative for urgency and frequency.  Musculoskeletal: Positive for myalgias and falls. Negative for back pain and joint pain.  Skin:       jaundice)  Neurological: Positive for weakness. Negative for sensory change, speech change and focal weakness.  Psychiatric/Behavioral: Negative for depression and hallucinations. The patient is not nervous/anxious.   All other systems reviewed and are negative.  Tolerating Diet:yes Tolerating PT: pending  DRUG ALLERGIES:   Allergies  Allergen Reactions  . Bee Venom Anaphylaxis  . Advil [Ibuprofen] Nausea And Vomiting  . Aleve [Naproxen Sodium] Nausea And Vomiting  . Betadine [Povidone Iodine] Other (See Comments)    Reaction:  Burning   . Codeine Other (See Comments)    Reaction:  GI upset     VITALS:  Blood pressure 102/59, pulse 80, temperature 97.4 F (36.3 C), temperature source Oral, resp. rate 18, height 5\' 4"  (1.626 m), weight 55.384 kg (122 lb 1.6 oz), SpO2 100 %.  PHYSICAL EXAMINATION:   Physical Exam  GENERAL:  65 y.o.-year-old patient lying in the bed with no acute distress. wweak EYES: Pupils equal, round, reactive to light and accommodation.++ scleral icterus. Extraocular muscles intact.   HEENT: Head atraumatic, normocephalic. Oropharynx and nasopharynx clear.  NECK:  Supple, no jugular venous distention. No thyroid enlargement, no tenderness.  LUNGS: Normal breath sounds bilaterally, no wheezing, rales, rhonchi. No use of accessory muscles of respiration.  CARDIOVASCULAR: S1, S2 normal. No murmurs, rubs, or gallops.  ABDOMEN: Soft, nontender, nondistended. Bowel sounds present. No organomegaly or mass.  EXTREMITIES: No cyanosis, clubbing or edema b/l.   Left elbow dressing+ stitches removed NEUROLOGIC: Cranial nerves II through XII are intact. No focal Motor or sensory deficits b/l.   PSYCHIATRIC:  patient is alert and oriented x 3.  SKIN: No obvious rash, lesion, or ulcer. bruises+  LABORATORY PANEL:  CBC  Recent Labs Lab 05/31/15 0610  WBC 10.5  HGB 8.3*  HCT 23.5*  PLT 115*    Chemistries   Recent Labs Lab 05/31/15 0610  06/01/15 0516  NA 123*  --  123*  K 3.3*  < > 3.9  CL 90*  --   --   CO2 26  --   --   GLUCOSE 129*  --   --   BUN 28*  --   --   CREATININE UNABLE TO REPORT DUE TO ICTERUS INTERFEARANCE  --   --   CALCIUM 7.8*  --   --   MG  --   < > 3.3*  AST 93*  --   --   ALT 35  --   --   ALKPHOS 123  --   --   BILITOT 21.2*  --   --   < > = values in this interval not displayed.  Cardiac Enzymes  Recent Labs Lab 05/30/15 1429  TROPONINI <0.03   RADIOLOGY:  Dg Chest Portable 1 View  05/30/2015  CLINICAL DATA:  65 year old female with history of weakness for the past few days. Jaundice. EXAM: PORTABLE CHEST 1 VIEW COMPARISON:  Chest x-ray 07/16/2011. FINDINGS: Mild diffuse peribronchial cuffing. Lung volumes are low. Linear opacities in the right lung base, compatible with mild scarring, similar to the prior examination. No consolidative airspace disease. No pleural effusions. No pneumothorax. No pulmonary nodule or mass noted. Pulmonary vasculature and the cardiomediastinal silhouette are within normal limits. IMPRESSION: 1. Mild diffuse  peribronchial cuffing suggestive of mild acute bronchitis. Electronically Signed   By: Vinnie Langton M.D.   On: 05/30/2015 16:42   US Abdomen Limited Ruq  05/30/2015  CLINICAL DATA:  Jaundice. EXAM: US ABDOMEN LIMITED - RIGHT UPPER QUADRANT COMPARISON:  Ultrasound of February 20, 2015. FINDINGS: Gallbladder: No gallstones visualized. No sonographic Murphy sign noted by sonographer. Mild gallbladder wall thickening measuring 4.6 mm is noted. No definite pericholecystic fluid is noted. Common bile duct: Diameter: 4.4 mm which is within normal limits. Liver: No focal lesion identified. Coarse echogenicity is noted with nodular hepatic margins suggesting early hepatic cirrhosis. Bidirectional flow is noted in the portal vein. Small amount of ascites is noted around the liver. IMPRESSION: Findings concerning for hepatic cirrhosis with minimal ascites. No biliary dilatation is noted. Mild gallbladder wall thickening is noted most likely due to adjacent hepatocellular disease. No cholelithiasis is noted. Electronically Signed   By: Marijo Conception, M.D.   On: 05/30/2015 18:27   ASSESSMENT AND PLAN:  65 year old female with past medical history of alcoholic liver cirrhosis, hypothyroidism, who presents to the hospital due to several electrolyte abnormalities and also noted to be jaundiced.  #1 hyponatremia-due to underlying chronic liver disease combined with dehydration. - will gently hydrate her with IV fluids and follow sodium.  -123-123--123 -po fluid restriction to 1200 cc  #2 Hypokalemia/hypomagnesimia due to Alcohol abuse - due to dehydration and poor by mouth intake. -repleted potassium and magnesium  #3 Alcoholic liver cirrhosis as seen on USG abd -with severe coagulopathy,low platelets - GI consult with Dr Tiffany Kocher  # 4 Hypothyroidism - cont. Synthroid.   #5 Chronic Anemia/Thrombocytopenia  - due to Liver Disease.  - no acute bleeding.   #6 Coagulopathy - due to Liver Disease.  - No  acute bleeding.   #7 UTI  Keflex po bid  PT to see pt  Case discussed with Care Management/Social Worker. Management plans discussed with the patient, family and they are in agreement.  CODE STATUS: full  DVT Prophylaxis: teds. Pt;s INR 3.6  TOTAL TIME TAKING CARE OF THIS PATIENT: 35 minutes.  >50% time spent on counselling and coordination of care  POSSIBLE D/C IN 1-2 DAYS, DEPENDING ON CLINICAL CONDITION.  Note: This dictation was prepared with Dragon dictation along with smaller phrase technology. Any transcriptional errors that result from this process are unintentional.  Jeanmarie Mccowen M.D on 06/01/2015 at 11:26 AM  Between 7am to 6pm - Pager - [phone]  After 6pm go to [email or web address] - password [email or web address] [facility]  [facility]  Office  [phone]  CC: Primary care physician; [place] Pink, MD

## 2015-06-01 NOTE — Progress Notes (Addendum)
MEDICATION RELATED CONSULT NOTE - INITIAL   Pharmacy Consult for electrolyte management/replacement Indication: hypokalemia, hypomagnesemia  Vital Signs: Temp: 97.4 F (36.3 C) (01/13 0438) Temp Source: Oral (01/13 0438) BP: 102/59 mmHg (01/13 0438) Pulse Rate: 80 (01/13 0438) Intake/Output from previous day: 01/12 0701 - 01/13 0700 In: 360 [P.O.:360] Out: 50 [Urine:50] Intake/Output from this shift:    Labs:  Recent Labs  05/30/15 1429 05/31/15 0610 05/31/15 1453  WBC 13.5* 10.5  --   HGB 9.3* 8.3*  --   HCT 26.9* 23.5*  --   PLT 134* 115*  --   APTT 63*  --   --   CREATININE  UNABLE TO RESULT DUE TO ICTERIC INTERFERENCE UNABLE TO REPORT DUE TO ICTERUS INTERFEARANCE  --   MG 1.0*  --  4.2*  PHOS  --  UNABLE TO REPORT DUE TO ICTERIC INTERFERENCE  --   ALBUMIN 2.4* 1.9*  --   PROT 8.5* 7.1  --   AST 121* 93*  --   ALT 45 35  --   ALKPHOS 144* 123  --   BILITOT 24.1* 21.2*  --   BILIDIR 8.4*  --   --   IBILI 15.7*  --   --    CrCl cannot be calculated (Patient has no serum creatinine result on file.).  Assessment: Pharmacy consulted to manage and follow electrolytes in this 65 year old female admitted with fatigue/jaundice secondary to EtOH liver cirrhosis, and low electrolytes.   On admission 1/11, labs were as follows: Mg 1.0  K 2.7 Patient was started on NS w/ 20 mEq KCl at 75 mL/hr and KCl 20 mEq PO BID  BMP ordered 1/12 with AM labs - K improved but remained low at 3.3 Ordered phosphorus add-on but unable to determine due to icterus  1/12 Gave Mg 4 g IV x 2 doses per protocol and start mag ox 400 mg daily 1/13 per MD Continue K-Dur 40 mEq PO BID for now K fluids still running   Goal of Therapy:  Electrolytes within normal limits  Plan:  1/12 1800 K -3.9, Mg=4.2 1/13 0516 K=3.9, Mg=3.3 Will continue K supplementation as above. Will d/c mag oxide for the time being. Will recheck both K and Mg tomorrow AM.  Pharmacy will continue to monitor, thank  you for the consult.  Samatha Anspach D Jahmani Staup 06/01/2015,7:27 AM

## 2015-06-02 LAB — BASIC METABOLIC PANEL
Anion gap: 7 (ref 5–15)
BUN: 41 mg/dL — AB (ref 6–20)
CHLORIDE: 101 mmol/L (ref 101–111)
CO2: 18 mmol/L — ABNORMAL LOW (ref 22–32)
Calcium: 7.9 mg/dL — ABNORMAL LOW (ref 8.9–10.3)
GLUCOSE: 148 mg/dL — AB (ref 65–99)
POTASSIUM: 5.4 mmol/L — AB (ref 3.5–5.1)
Sodium: 126 mmol/L — ABNORMAL LOW (ref 135–145)

## 2015-06-02 LAB — AFP TUMOR MARKER: AFP-Tumor Marker: 8.2 ng/mL (ref 0.0–8.3)

## 2015-06-02 LAB — MAGNESIUM: Magnesium: 2.7 mg/dL — ABNORMAL HIGH (ref 1.7–2.4)

## 2015-06-02 MED ORDER — ADULT MULTIVITAMIN W/MINERALS CH
1.0000 | ORAL_TABLET | Freq: Every day | ORAL | Status: DC
  Administered 2015-06-02: 11:00:00 1 via ORAL
  Filled 2015-06-02: qty 1

## 2015-06-02 MED ORDER — FUROSEMIDE 40 MG PO TABS: 20.0000 mg | ORAL_TABLET | Freq: Every day | ORAL | Status: DC

## 2015-06-02 MED ORDER — SODIUM POLYSTYRENE SULFONATE 15 GM/60ML PO SUSP: 30.0000 g | Freq: Once | ORAL | Status: DC

## 2015-06-02 MED ORDER — PREDNISONE 20 MG PO TABS: 40.0000 mg | ORAL_TABLET | Freq: Every day | ORAL | Status: DC

## 2015-06-02 MED ORDER — ADULT MULTIVITAMIN W/MINERALS CH: 1.0000 | ORAL_TABLET | Freq: Every day | ORAL | Status: DC

## 2015-06-02 NOTE — Progress Notes (Addendum)
Oris Drone, RN Registered Nurse Signed Nursing Progress Notes 06/02/2015 11:10 AM    Expand All Collapse All   MD order received to discharge pt home today in [facility]; Care Management previously established [place] with [place]; verbally reviewed AVS with pt and pt's family member including medications/gave Rx for MVI with minerals to pt, Prednisone Rx has been received electronically at [facility] [employer] in [place], [place]; activity level/increase activity slowly; follow up appointments/pt to call and schedule appointments with Dr Kary Kos for 1 week and K. Jerelene Redden NP for 4 days; pt verbalized understanding with no questions voiced at this time

## 2015-06-02 NOTE — Progress Notes (Signed)
MEDICATION RELATED CONSULT NOTE -follow up  Pharmacy Consult for electrolyte management/replacement Indication: hypokalemia, hypomagnesemia  Vital Signs:   Intake/Output from previous day: 01/13 0701 - 01/14 0700 In: 2136 [P.O.:1020; I.V.:1116] Out: 550 [Urine:550] Intake/Output from this shift:    Labs:  Recent Labs  05/30/15 1429 05/31/15 0610 05/31/15 1453 06/01/15 0516 06/02/15 0532  WBC 13.5* 10.5  --   --   --   HGB 9.3* 8.3*  --   --   --   HCT 26.9* 23.5*  --   --   --   PLT 134* 115*  --   --   --   APTT 63*  --   --   --   --   CREATININE  UNABLE TO RESULT DUE TO ICTERIC INTERFERENCE UNABLE TO REPORT DUE TO ICTERUS INTERFEARANCE  --   --  SEE COMMENTS  MG 1.0*  --  4.2* 3.3* 2.7*  PHOS  --  UNABLE TO REPORT DUE TO ICTERIC INTERFERENCE  --   --   --   ALBUMIN 2.4* 1.9*  --   --   --   PROT 8.5* 7.1  --   --   --   AST 121* 93*  --   --   --   ALT 45 35  --   --   --   ALKPHOS 144* 123  --   --   --   BILITOT 24.1* 21.2*  --   --   --   BILIDIR 8.4*  --   --   --   --   IBILI 15.7*  --   --   --   --    CrCl cannot be calculated (Patient has no serum creatinine result on file.).   Lab Results  Component Value Date   K 5.4* 06/02/2015   Assessment: Pharmacy consulted to manage and follow electrolytes in this 65 year old female admitted with fatigue/jaundice secondary to EtOH liver cirrhosis, and low electrolytes.   On admission 1/11, labs were as follows: Mg 1.0  K 2.7 Patient was started on NS w/ 20 mEq KCl at 75 mL/hr and KCl 20 mEq PO BID  BMP ordered 1/12 with AM labs - K improved but remained low at 3.3 Ordered phosphorus add-on but unable to determine due to icterus  1/12 Gave Mg 4 g IV x 2 doses per protocol and start mag ox 400 mg daily 1/13 per MD Continue K-Dur 40 mEq PO BID for now K fluids still running   Goal of Therapy:  Electrolytes within normal limits  Plan:  1/12 1800 K -3.9, Mg=4.2 1/13 0516 K=3.9, Mg=3.3 Will continue K  supplementation as above. Will d/c mag oxide for the time being. Will recheck both K and Mg tomorrow AM.  1/14:  K 5.4  Mag 2.7.  Magnesium and Potassium orders discontinued by MD. Willette Cluster ordered by MD.  Pharmacy will continue to monitor, thank you for the consult.  Kiam Bransfield A 06/02/2015,9:03 AM

## 2015-06-02 NOTE — Progress Notes (Signed)
Oris Drone, RN Registered Nurse Signed Nursing Progress Notes 06/02/2015 11:17 AM    Expand All Collapse All   Pt discharged via wheelchair by nursing to the [place] entrance

## 2015-06-02 NOTE — Discharge Summary (Signed)
[facility] [place] NAME: Anne Hickman    MR#:  [id number]  DATE OF BIRTH:  11-Jul-1950  DATE OF ADMISSION:  05/30/2015 ADMITTING PHYSICIAN: Henreitta Leber, MD  DATE OF DISCHARGE: 06/02/15  PRIMARY CARE PHYSICIAN: Maryland Pink, MD    ADMISSION DIAGNOSIS:  Hypokalemia [E87.6] Hyperbilirubinemia [E80.6] Hyponatremia [E87.1] Jaundice [R17]  DISCHARGE DIAGNOSIS:  Alcohol induced hepatitis/cirrhosis of liver Hyponatremia-improving Hypomagnesimia/hypokalemia-repeleted  SECONDARY DIAGNOSIS:   Past Medical History  Diagnosis Date  . Insomnia   . Generalized anxiety disorder   . Other and unspecified hyperlipidemia   . Unspecified hypothyroidism   . Alcohol abuse, episodic   . Eczema herpeticum     HOSPITAL COURSE:  65 year old female with past medical history of alcoholic liver cirrhosis, hypothyroidism, who presents to the hospital due to several electrolyte abnormalities and also noted to be jaundiced.  #1 hyponatremia-due to underlying chronic liver disease combined with dehydration. -recievedIV fluids and follow sodium.  -123-123--123--125--126 -po fluid restriction to 1200 cc -mentating well D/c IVF Will hold off lasix for now as outp but resume spironolactone  #2 Hypokalemia/hypomagnesimia due to Alcohol abuse -repleted potassium and magnesium  #3 Alcoholic liver cirrhosis as seen on [facility] abd -with severe coagulopathy,low platelets - GI consult with Dr Tiffany Kocher -on po prednisone  # 4 Hypothyroidism - cont. Synthroid.   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  - no acute bleeding.   #6 Coagulopathy - due to Liver Disease.  - No acute bleeding.   PT recommends HHPT  Overall improving. D/w GI ok to go home  CONSULTS OBTAINED:     DRUG ALLERGIES:   Allergies  Allergen Reactions  . Bee Venom Anaphylaxis  . Advil [Ibuprofen] Nausea And Vomiting  . Aleve [Naproxen Sodium]  Nausea And Vomiting  . Betadine [Povidone Iodine] Other (See Comments)    Reaction:  Burning   . Codeine Other (See Comments)    Reaction:  GI upset     DISCHARGE MEDICATIONS:   Current Discharge Medication List    START taking these medications   Details  Multiple Vitamin (MULTIVITAMIN WITH MINERALS) TABS tablet Take 1 tablet by mouth daily. Qty: 30 tablet, Refills: 0    predniSONE (DELTASONE) 20 MG tablet Take 2 tablets (40 mg total) by mouth daily with breakfast. Qty: 20 tablet, Refills: 0      CONTINUE these medications which have NOT CHANGED   Details  levothyroxine (SYNTHROID, LEVOTHROID) 75 MCG tablet Take 75 mcg by mouth daily before breakfast.    pantoprazole (PROTONIX) 40 MG tablet Take 40 mg by mouth daily.    spironolactone (ALDACTONE) 50 MG tablet Take 50 mg by mouth daily.    ondansetron (ZOFRAN) 4 MG tablet Take 1 tablet (4 mg total) by mouth every 8 (eight) hours as needed for nausea or vomiting. Qty: 20 tablet, Refills: 0    traMADol (ULTRAM) 50 MG tablet Take 1 tablet (50 mg total) by mouth every 6 (six) hours as needed. Qty: 12 tablet, Refills: 0      STOP taking these medications     furosemide (LASIX) 40 MG tablet      doxycycline (VIBRAMYCIN) 100 MG capsule         If you experience worsening of your admission symptoms, develop shortness of breath, life threatening emergency, suicidal or homicidal thoughts you must seek medical attention immediately by calling 911 or calling your MD immediately  if symptoms less severe.  You Must read complete  instructions/literature along with all the possible adverse reactions/side effects for all the Medicines you take and that have been prescribed to you. Take any new Medicines after you have completely understood and accept all the possible adverse reactions/side effects.   Please note  You were cared for by a hospitalist during your hospital stay. If you have any questions about your discharge medications  or the care you received while you were in the hospital after you are discharged, you can call the unit and asked to speak with the hospitalist on call if the hospitalist that took care of you is not available. Once you are discharged, your primary care physician will handle any further medical issues. Please note that NO REFILLS for any discharge medications will be authorized once you are discharged, as it is imperative that you return to your primary care physician (or establish a relationship with a primary care physician if you do not have one) for your aftercare needs so that they can reassess your need for medications and monitor your lab values. Today   SUBJECTIVE   Feels ok  VITAL SIGNS:  Blood pressure 106/52, pulse 82, temperature 97.7 F (36.5 C), temperature source Oral, resp. rate 17, height 5\' 4"  (1.626 m), weight 55.384 kg (122 lb 1.6 oz), SpO2 97 %.  I/O:    Intake/Output Summary (Last 24 hours) at 06/02/15 0855 Last data filed at 06/02/15 0350  Gross per 24 hour  Intake   1581 ml  Output    400 ml  Net   1181 ml    PHYSICAL EXAMINATION:  GENERAL:  65 y.o.-year-old patient lying in the bed with no acute distress.  EYES: Pupils equal, round, reactive to light and accommodation.++ scleral icterus. Extraocular muscles intact.  HEENT: Head atraumatic, normocephalic. Oropharynx and nasopharynx clear.  NECK:  Supple, no jugular venous distention. No thyroid enlargement, no tenderness.  LUNGS: Normal breath sounds bilaterally, no wheezing, rales,rhonchi or crepitation. No use of accessory muscles of respiration.  CARDIOVASCULAR: S1, S2 normal. No murmurs, rubs, or gallops.  ABDOMEN: Soft, non-tender, non-distended. Bowel sounds present. No organomegaly or mass.  EXTREMITIES: No pedal edema, cyanosis, or clubbing.  NEUROLOGIC: Cranial nerves II through XII are intact. Muscle strength 5/5 in all extremities. Sensation intact. Gait not checked.  PSYCHIATRIC: The patient is  alert and oriented x 3.  SKIN: No obvious rash, lesion, or ulcer. Bruises+  DATA REVIEW:   CBC   Recent Labs Lab 05/31/15 0610  WBC 10.5  HGB 8.3*  HCT 23.5*  PLT 115*    Chemistries   Recent Labs Lab 05/31/15 0610  06/02/15 0532  NA 123*  < > 126*  K 3.3*  < > 5.4*  CL 90*  --  101  CO2 26  --  18*  GLUCOSE 129*  --  148*  BUN 28*  --  41*  CREATININE UNABLE TO REPORT DUE TO ICTERUS INTERFEARANCE  --  SEE COMMENTS  CALCIUM 7.8*  --  7.9*  MG  --   < > 2.7*  AST 93*  --   --   ALT 35  --   --   ALKPHOS 123  --   --   BILITOT 21.2*  --   --   < > = values in this interval not displayed.  Microbiology Results   No results found for this or any previous visit (from the past 240 hour(s)).  RADIOLOGY:  No results found.   Management plans discussed with the patient,  family and they are in agreement.  CODE STATUS:     Code Status Orders        Start     Ordered   05/30/15 2123  Full code   Continuous     05/30/15 2122    Code Status History    Date Active Date Inactive Code Status Order ID Comments User Context   This patient has a current code status but no historical code status.      TOTAL TIME TAKING CARE OF THIS PATIENT: 40 minutes.    Anne Hickman M.D on 06/02/2015 at 8:55 AM  Between 7am to 6pm - Pager - [phone] After 6pm go to [email or web address] - password [email or web address] [facility]  [facility] [place] [facility]  Office  [phone]  CC: Primary care physician; Maryland Pink, MD

## 2015-06-02 NOTE — Progress Notes (Signed)
Nutrition Education Note  RD consulted for nutrition education regarding liver disease.  RD provided "Cirrhosis Nutrition Therapy" handout from the Academy of Nutrition and Dietetics. Reviewed patient's dietary recall. Provided examples on ways to decrease sodium intake in diet. Discouraged intake of processed foods and use of salt shaker. Encouraged fresh fruits and vegetables as well as whole grain sources of carbohydrates to maximize fiber intake and lean protein.   RD discussed why it is important for patient to adhere to diet recommendations, and emphasized the role of fluids and foods to avoid. Teach back method used.  Expect good compliance.   Current diet order is regular,  patient is consuming approximately 50% of meals at this time. Labs and medications reviewed. No further nutrition interventions warranted at this time. RD contact information provided. If additional nutrition issues arise, please re-consult RD.   Anne Sypher B. Anne Hickman, Anne Hickman, Anne Hickman (pager) Weekend/On-Call pager [phone])

## 2015-06-02 NOTE — Discharge Instructions (Signed)
STOP drinking alcohol  [facility]

## 2015-06-02 NOTE — Care Management Note (Signed)
Case Management Note  Patient Details  Name: Anne Hickman MRN: [id number] Date of Birth: Mar 01, 1951  Subjective/Objective:       Discussed discharge planning with Anne Hickman who has no choice of home health PT providers but agreed upon [facility]. A referral was faxed to [facility] requesting home health PT services.  [facility]-PT dis not recommend any DME equipment.             Action/Plan:   Expected Discharge Date:                  Expected Discharge Plan:     In-House Referral:     Discharge planning Services     Post Acute Care Choice:    Choice offered to:     DME Arranged:    DME Agency:     HH Arranged:    [place] Agency:     Status of Service:     Medicare Important Message Given:    Date Medicare IM Given:    Medicare IM give by:    Date Additional Medicare IM Given:    Additional Medicare Important Message give by:     If discussed at [place] of Stay Meetings, dates discussed:    Additional Comments:  Karishma Unrein A, RN 06/02/2015, 9:00 AM

## 2015-06-08 ENCOUNTER — Emergency Department
Admission: EM | Admit: 2015-06-08 | Discharge: 2015-06-09 | Disposition: A | Discharge status: Home / Self Care | Payer: BLUE CROSS/BLUE SHIELD | Attending: Emergency Medicine | Admitting: Emergency Medicine

## 2015-06-08 ENCOUNTER — Encounter: Payer: Self-pay | Admitting: Emergency Medicine

## 2015-06-08 ENCOUNTER — Emergency Department: Payer: BLUE CROSS/BLUE SHIELD

## 2015-06-08 DIAGNOSIS — F1010 Alcohol abuse, uncomplicated: Secondary | ICD-10-CM | POA: Insufficient documentation

## 2015-06-08 DIAGNOSIS — K7031 Alcoholic cirrhosis of liver with ascites: Secondary | ICD-10-CM | POA: Insufficient documentation

## 2015-06-08 DIAGNOSIS — E860 Dehydration: Secondary | ICD-10-CM | POA: Diagnosis not present

## 2015-06-08 DIAGNOSIS — Z7952 Long term (current) use of systemic steroids: Secondary | ICD-10-CM | POA: Diagnosis not present

## 2015-06-08 DIAGNOSIS — M7981 Nontraumatic hematoma of soft tissue: Secondary | ICD-10-CM | POA: Diagnosis not present

## 2015-06-08 DIAGNOSIS — R7989 Other specified abnormal findings of blood chemistry: Secondary | ICD-10-CM | POA: Diagnosis present

## 2015-06-08 DIAGNOSIS — Z79899 Other long term (current) drug therapy: Secondary | ICD-10-CM | POA: Diagnosis not present

## 2015-06-08 LAB — BASIC METABOLIC PANEL
Anion gap: 13 (ref 5–15)
BUN: 99 mg/dL — AB (ref 6–20)
CHLORIDE: 101 mmol/L (ref 101–111)
CO2: 18 mmol/L — AB (ref 22–32)
Calcium: 8.6 mg/dL — ABNORMAL LOW (ref 8.9–10.3)
Glucose, Bld: 166 mg/dL — ABNORMAL HIGH (ref 65–99)
Potassium: 4.3 mmol/L (ref 3.5–5.1)
Sodium: 132 mmol/L — ABNORMAL LOW (ref 135–145)

## 2015-06-08 LAB — URINALYSIS COMPLETE WITH MICROSCOPIC (ARMC ONLY)
Bilirubin Urine: NEGATIVE
GLUCOSE, UA: NEGATIVE mg/dL
Hgb urine dipstick: NEGATIVE
KETONES UR: NEGATIVE mg/dL
LEUKOCYTES UA: NEGATIVE
NITRITE: NEGATIVE
PH: 5 (ref 5.0–8.0)
Protein, ur: NEGATIVE mg/dL
SPECIFIC GRAVITY, URINE: 1.006 (ref 1.005–1.030)

## 2015-06-08 LAB — CBC WITH DIFFERENTIAL/PLATELET
BASOS ABS: 0 10*3/uL (ref 0–0.1)
Band Neutrophils: 3 %
Basophils Relative: 0 %
Blasts: 0 %
EOS PCT: 0 %
Eosinophils Absolute: 0 10*3/uL (ref 0–0.7)
HEMATOCRIT: 24 % — AB (ref 35.0–47.0)
Hemoglobin: 8.2 g/dL — ABNORMAL LOW (ref 12.0–16.0)
LYMPHS ABS: 1.5 10*3/uL (ref 1.0–3.6)
Lymphocytes Relative: 6 %
MCH: 34 pg (ref 26.0–34.0)
MCHC: 34.3 g/dL (ref 32.0–36.0)
MCV: 99.2 fL (ref 80.0–100.0)
METAMYELOCYTES PCT: 1 %
MYELOCYTES: 4 %
Monocytes Absolute: 1.5 10*3/uL — ABNORMAL HIGH (ref 0.2–0.9)
Monocytes Relative: 6 %
NEUTROS PCT: 80 %
Neutro Abs: 22.3 10*3/uL — ABNORMAL HIGH (ref 1.4–6.5)
Other: 0 %
PLATELETS: 260 10*3/uL (ref 150–440)
PROMYELOCYTES ABS: 0 %
RBC: 2.42 MIL/uL — AB (ref 3.80–5.20)
RDW: 21.3 % — AB (ref 11.5–14.5)
WBC: 25.3 10*3/uL — AB (ref 3.6–11.0)
nRBC: 0 /100 WBC

## 2015-06-08 LAB — HEPATIC FUNCTION PANEL
ALT: 72 U/L — AB (ref 14–54)
AST: 124 U/L — AB (ref 15–41)
Albumin: 2 g/dL — ABNORMAL LOW (ref 3.5–5.0)
Alkaline Phosphatase: 148 U/L — ABNORMAL HIGH (ref 38–126)
BILIRUBIN DIRECT: 12.7 mg/dL — AB (ref 0.1–0.5)
Indirect Bilirubin: 9.3 mg/dL — ABNORMAL HIGH (ref 0.3–0.9)
Total Bilirubin: 22 mg/dL (ref 0.3–1.2)
Total Protein: 7.4 g/dL (ref 6.5–8.1)

## 2015-06-08 LAB — PROTIME-INR
INR: 2.8
PROTHROMBIN TIME: 29.1 s — AB (ref 11.4–15.0)

## 2015-06-08 LAB — TROPONIN I: Troponin I: 0.04 ng/mL — ABNORMAL HIGH (ref <0.031)

## 2015-06-08 LAB — AMMONIA: AMMONIA: 60 umol/L — AB (ref 9–35)

## 2015-06-08 LAB — LIPASE, BLOOD: Lipase: 77 U/L — ABNORMAL HIGH (ref 11–51)

## 2015-06-08 LAB — LACTIC ACID, PLASMA
LACTIC ACID, VENOUS: 1.4 mmol/L (ref 0.5–2.0)
LACTIC ACID, VENOUS: 1.9 mmol/L (ref 0.5–2.0)

## 2015-06-08 MED ORDER — SODIUM CHLORIDE 0.9 % IV BOLUS (SEPSIS)
1000.0000 mL | Freq: Once | INTRAVENOUS | Status: AC
  Administered 2015-06-08: 1000 mL via INTRAVENOUS

## 2015-06-08 MED ORDER — SODIUM CHLORIDE 0.9 % IV SOLN
Freq: Once | INTRAVENOUS | Status: AC
  Administered 2015-06-08: 16:00:00 via INTRAVENOUS

## 2015-06-08 MED ORDER — BARIUM SULFATE 2 % PO SUSP
450.0000 mL | Freq: Once | ORAL | Status: AC
  Administered 2015-06-08: 450 mL via ORAL

## 2015-06-08 NOTE — ED Provider Notes (Signed)
[facility] Emergency Department Provider Note   ____________________________________________  Time seen: Approximately 4pm I have reviewed the triage vital signs and the triage nursing note.  HISTORY  Chief Complaint Abnormal Lab   Historian Patient is a limited historian, poor historian Spouse provides history  HPI Anne Hickman is a 65 y.o. female with renal failure, alcohol induced, with recent diagnosis and discharge from hospital.  She had follow up with pa at [facility] clinic with bloodwork yesterday.  Called today by dr. Percell [place] office, with abnormal kidney function, wbc, lfts and told to come to ED for eval.  Has been on prednisone taper.  Denies any new symptoms.  Chronic confusion, but not worse.  No cough or trouble breathing.  No fever.  No skin rash.  No abd pain.  No vomiting or diarrhea or urinary symptoms.  Asymptomatic per patient.    Past Medical History  Diagnosis Date  . Insomnia   . Generalized anxiety disorder   . Other and unspecified hyperlipidemia   . Unspecified hypothyroidism   . Alcohol abuse, episodic   . Eczema herpeticum     Patient Active Problem List   Diagnosis Date Noted  . Protein-calorie malnutrition, severe 05/31/2015  . Alcoholic hepatitis [id number]  . Adult hypothyroidism 11/17/2014  . Polyclonal gammopathy determined by serum protein electrophoresis 11/17/2014  . ALC (alcoholic liver cirrhosis) ([place]) 09/22/2014  . Absolute anemia 09/22/2014  . Calculus of gallbladder 09/22/2014  . History of melena 09/22/2014  . Dizziness, nonspecific 07/09/2014  . Cervical adenopathy 07/09/2014  . Pain in joint, ankle and foot 10/07/2013  . Hepatitis, alcoholic, acute [id number]  . Hypomagnesemia 07/19/2013  . Alcohol abuse 02/11/2012  . GAD (generalized anxiety disorder) 02/11/2012  . Unspecified hypothyroidism 02/11/2012    Past Surgical History  Procedure Laterality Date  . Appendectomy  1978  . Abdominal  hysterectomy      Partial   . Esophagogastroduodenoscopy (egd) with propofol N/A 12/13/2014    Procedure: ESOPHAGOGASTRODUODENOSCOPY (EGD) WITH PROPOFOL;  Surgeon: Manya Silvas, MD;  Location: [employer] [facility];  Service: Endoscopy;  Laterality: N/A;    Current Outpatient Rx  Name  Route  Sig  Dispense  Refill  . levothyroxine (SYNTHROID, LEVOTHROID) 75 MCG tablet   Oral   Take 75 mcg by mouth daily before breakfast.         . Multiple Vitamin (MULTIVITAMIN WITH MINERALS) TABS tablet   Oral   Take 1 tablet by mouth daily.   30 tablet   0   . pantoprazole (PROTONIX) 40 MG tablet   Oral   Take 40 mg by mouth daily.         . predniSONE (DELTASONE) 20 MG tablet   Oral   Take 2 tablets (40 mg total) by mouth daily with breakfast.   20 tablet   0   . spironolactone (ALDACTONE) 50 MG tablet   Oral   Take 50 mg by mouth daily.         . ondansetron (ZOFRAN) 4 MG tablet   Oral   Take 1 tablet (4 mg total) by mouth every 8 (eight) hours as needed for nausea or vomiting. Patient not taking: Reported on 05/30/2015   20 tablet   0   . traMADol (ULTRAM) 50 MG tablet   Oral   Take 1 tablet (50 mg total) by mouth every 6 (six) hours as needed. Patient not taking: Reported on 05/30/2015   12 tablet   0  Allergies Bee venom; Advil; Aleve; Betadine; and Codeine  Family History  Problem Relation Age of Onset  . Coronary artery disease Father   . Heart disease Father   . Breast cancer Maternal Aunt 60  . Mental illness Mother     Social History Social History  Substance Use Topics  . Smoking status: Never Smoker   . Smokeless tobacco: Never Used  . Alcohol Use: No     Comment: 1- 2 bottles of wine a day    Review of Systems  Constitutional: Negative for fever. Eyes: Negative for visual changes. ENT: Negative for sore throat. Cardiovascular: Negative for chest pain. Respiratory: Negative for shortness of breath. Gastrointestinal: Negative for abdominal  pain, vomiting and diarrhea. Genitourinary: Negative for dysuria. Musculoskeletal: Negative for back pain. Skin: Negative for rash. Neurological: Negative for headache. 10 point Review of Systems otherwise negative ____________________________________________   PHYSICAL EXAM:  VITAL SIGNS: ED Triage Vitals  Enc Vitals Group     BP 06/08/15 1526 115/52 mmHg     Pulse Rate 06/08/15 1526 81     Resp 06/08/15 1526 20     Temp --      Temp Source 06/08/15 1526 Axillary     SpO2 06/08/15 1526 99 %     Weight 06/08/15 1526 122 lb (55.339 kg)     Height --      Head Cir --      Peak Flow --      Pain Score 06/08/15 1527 0     Pain Loc --      Pain Edu? --      Excl. in [place]? --      Constitutional: Alert and cooperative but poor historian. Severely jaundiced.  In no distress. Eyes: Conjunctivae are normal. PERRL. Normal extraocular movements. ENT   Head: Normocephalic and atraumatic.   Nose: No congestion/rhinnorhea.   Mouth/Throat: Mucous membranes are moist.   Neck: No stridor. Cardiovascular/Chest: Normal rate, regular rhythm.  No murmurs, rubs, or gallops. Respiratory: Normal respiratory effort without tachypnea nor retractions. Breath sounds are clear and equal bilaterally. No wheezes/rales/rhonchi. Gastrointestinal: Soft.Mild distention/fluid.  Nontender abdomen,  Genitourinary/rectal:Deferred Musculoskeletal: Nontender with normal range of motion in all extremities. No joint effusions.  No lower extremity tenderness.  No edema. Neurologic:  Normal speech and language. No gross or focal neurologic deficits are appreciated. Skin:  Skin is warm, dry and intact jaundiced.  Many ecchymosis.   ____________________________________________   EKG I, Lisa Roca, MD, the attending physician have personally viewed and interpreted all ECGs.  74 bpm. Sinus rhythm. Narrow QRS. Normal axis. Nonspecific ST and T-wave ____________________________________________  LABS  (pertinent positives/negatives)  Lactate 1.9 INR 2.8 Troponin 0000000 Basic metabolic panel significant for sodium 132, CO2 18, BUN 99 and creatinine and GFR not reported due icteris AST 124, aLT 72, alkaline phosphatase 148, bili total 22 Lipase 77 Ammonia is 60 White blood count 25.3, hemoglobin 8.2, platelet count 260  ____________________________________________  RADIOLOGY All Xrays were viewed by me. Imaging interpreted by Radiologist.  Chest portable:  IMPRESSION: Prominence of the right suprahilar region which may be seen with overlapping vascular shadows, lymphadenopathy or mediastinal mass. Repeated PA and lateral radiograph of the chest or CT of the chest may be considered for further evaluation. __________________________________________  PROCEDURES  Procedure(s) performed: None  Critical Care performed: None  ____________________________________________   ED COURSE / ASSESSMENT AND PLAN  Pertinent labs & imaging results that were available during my care of the patient were reviewed by  me and considered in my medical decision making (see chart for details).  Sent for abnormal labs, elevated wbc, bili, and renal failure yesterday at [facility] clinic.  No new symptoms per patient or spouse.  Stable vital signs with no fever or hypotension.  Laboratory evaluation her white blood cell count is up even higher than it was yesterday, 20-25. There is no clinical evidence of infection, including no symptoms for infection and no evidence of infection on chest x-ray, CT of the abdomen, or urine sample.  Her INR is similar and somewhat lower than it was when she was in the hospital at 2.8. Her hemoglobin is stable. Her ammonia is higher at 60, and she is confused, although husband states not any more confused than she has been.  Her BUN is elevated to 99, was previously in the 40s. I suspect this is dehydration related she's been on fluid restrict him. Her hyponatremia has actually  improved from previously. She was given normal saline bolus here in the emergency department and I will raise her fluid restriction.    CONSULTATIONS:  Dr. Candace Cruise, gastroenterology -- discussed labs which are similar to when pt left the hospital.  Recommends ok for outpatient follow up.   Patient / Family / Caregiver informed of clinical course, medical decision-making process, and agree with plan.   I discussed return precautions, follow-up instructions, and discharged instructions with patient and/or family.   DISCHARGE INSTRUCTIONS:  You were evaluated for abnormal labs, and your exam and evaluation showed some dehydration and elevated white blood cell count which is felt to likely be due to prednisone use rather than infection.  You can increase your fluid intake to no more than double what you've been doing (80 ounces).  Return to the emergency room for any worsening condition including abdominal pain, fever, confusion altered mental status, trouble breathing, chest pain, or any other symptoms concerning to you. ___________________________________________   FINAL CLINICAL IMPRESSION(S) / ED DIAGNOSES   Final diagnoses:  Alcoholic cirrhosis of liver with ascites ([place])  Dehydration              Note: This dictation was prepared with Dragon dictation. Any transcriptional errors that result from this process are unintentional   Lisa Roca, MD 06/08/15 2342

## 2015-06-08 NOTE — ED Notes (Signed)
MD Lord at bedside. 

## 2015-06-08 NOTE — ED Notes (Signed)
Pt ambulated to bathroom x assist of 1 with no concerns. Pt tolerated well. No acute distress noted.

## 2015-06-08 NOTE — ED Notes (Signed)
Patient transported to CT at this time. 

## 2015-06-08 NOTE — ED Notes (Signed)
Lab called regarding hepatic panel add on. Will add on at this time

## 2015-06-08 NOTE — ED Notes (Signed)
Pt to ed due to MD sending her because of abnormal labs that were drawn yesterday.

## 2015-06-08 NOTE — Discharge Instructions (Signed)
You were evaluated for abnormal labs, and your exam and evaluation showed some dehydration and elevated white blood cell count which is felt to likely be due to prednisone use rather than infection.  You can increase your fluid intake to no more than double what you've been doing (80 ounces).  Return to the emergency room for any worsening condition including abdominal pain, fever, confusion altered mental status, trouble breathing, chest pain, or any other symptoms concerning to you.   Alcoholic Liver Disease Alcoholic liver disease happens when the liver does not work the way it should. The condition is caused by drinking too much alcohol for many years.  HOME CARE  Do not drink alcohol.  Take medicines only as told by your doctor.  Take vitamins only as told by your doctor.  Follow any diet instructions that your doctor gave you. You may need to:  Eat foods that have thiamine. These include whole-wheat cereals, pork, and raw vegetables.  Eat foods that have folic acid. These include vegetables, fruits, meats, beans, nuts, and dairy foods.  Eat foods that are high in carbohydrates. These include yogurt, beans, potatoes, and rice. GET HELP IF:  You have a fever.  You are short of breath.  You have trouble breathing.  You have bright red blood in your poop (stool).  Your poop looks like tar.  You throw up (vomit) blood.  Your skin looks more yellow, pale, or dark.  You get headaches.  You have trouble thinking.  You have trouble balancing or walking.   This information is not intended to replace advice given to you by your health care provider. Make sure you discuss any questions you have with your health care provider.   Document Released: 03/02/2009 Document Revised: 09/19/2014 Document Reviewed: 04/06/2014 Elsevier Interactive Patient Education [employer].

## 2015-06-08 NOTE — ED Notes (Signed)
Pt given crackers and ice water per okay from MD [facility]. Pt sitting up in bed eating tolerating well. No acute distress noted.

## 2015-06-08 NOTE — ED Notes (Signed)
Lab called regarding trop add on. Will add on at this time

## 2015-06-08 NOTE — ED Notes (Signed)
Ct called again, states on the way to pick pt up

## 2015-06-08 NOTE — ED Notes (Signed)
Pt returned from CT at this time.  

## 2015-06-08 NOTE — ED Notes (Signed)
MD at bedside. 

## 2015-06-08 NOTE — ED Notes (Signed)
Portable Xray at bedside at this time.  

## 2015-06-09 NOTE — ED Notes (Addendum)
Pt's dressing for left IV was placed over a scab. When dressing was removed scab was pull up with dressing. Pt had bleeding from scab wound due to liver/platlet issues and altered labs. MD notified.  Verbal order given to place xeroform and gauze dressing on site. Dressing placed. Bleeding controlled. No acute distress noted.

## 2015-06-09 NOTE — ED Notes (Signed)
Reviewed d/c instructions and follow-up care with pt. Pt verbalized understanding 

## 2015-06-11 LAB — PATHOLOGIST SMEAR REVIEW

## 2015-06-12 ENCOUNTER — Inpatient Hospital Stay: Payer: BLUE CROSS/BLUE SHIELD

## 2015-06-12 ENCOUNTER — Inpatient Hospital Stay
Admission: AD | Admit: 2015-06-12 | Discharge: 2015-06-19 | DRG: 441 | Disposition: A | Discharge status: Hospice | Payer: BLUE CROSS/BLUE SHIELD | Source: Ambulatory Visit | Attending: Internal Medicine | Admitting: Internal Medicine

## 2015-06-12 DIAGNOSIS — F411 Generalized anxiety disorder: Secondary | ICD-10-CM | POA: Diagnosis present

## 2015-06-12 DIAGNOSIS — R600 Localized edema: Secondary | ICD-10-CM | POA: Diagnosis not present

## 2015-06-12 DIAGNOSIS — K7290 Hepatic failure, unspecified without coma: Secondary | ICD-10-CM | POA: Diagnosis present

## 2015-06-12 DIAGNOSIS — D684 Acquired coagulation factor deficiency: Secondary | ICD-10-CM | POA: Diagnosis present

## 2015-06-12 DIAGNOSIS — N189 Chronic kidney disease, unspecified: Secondary | ICD-10-CM | POA: Diagnosis present

## 2015-06-12 DIAGNOSIS — Z66 Do not resuscitate: Secondary | ICD-10-CM | POA: Diagnosis present

## 2015-06-12 DIAGNOSIS — L0390 Cellulitis, unspecified: Secondary | ICD-10-CM | POA: Diagnosis present

## 2015-06-12 DIAGNOSIS — Z452 Encounter for adjustment and management of vascular access device: Secondary | ICD-10-CM

## 2015-06-12 DIAGNOSIS — Z515 Encounter for palliative care: Secondary | ICD-10-CM | POA: Diagnosis present

## 2015-06-12 DIAGNOSIS — B000 Eczema herpeticum: Secondary | ICD-10-CM | POA: Diagnosis present

## 2015-06-12 DIAGNOSIS — E43 Unspecified severe protein-calorie malnutrition: Secondary | ICD-10-CM | POA: Diagnosis present

## 2015-06-12 DIAGNOSIS — N179 Acute kidney failure, unspecified: Secondary | ICD-10-CM | POA: Diagnosis present

## 2015-06-12 DIAGNOSIS — Z818 Family history of other mental and behavioral disorders: Secondary | ICD-10-CM | POA: Diagnosis not present

## 2015-06-12 DIAGNOSIS — E039 Hypothyroidism, unspecified: Secondary | ICD-10-CM | POA: Diagnosis present

## 2015-06-12 DIAGNOSIS — L97929 Non-pressure chronic ulcer of unspecified part of left lower leg with unspecified severity: Secondary | ICD-10-CM | POA: Diagnosis present

## 2015-06-12 DIAGNOSIS — L97919 Non-pressure chronic ulcer of unspecified part of right lower leg with unspecified severity: Secondary | ICD-10-CM | POA: Diagnosis present

## 2015-06-12 DIAGNOSIS — K767 Hepatorenal syndrome: Secondary | ICD-10-CM | POA: Diagnosis present

## 2015-06-12 DIAGNOSIS — Z885 Allergy status to narcotic agent status: Secondary | ICD-10-CM | POA: Diagnosis not present

## 2015-06-12 DIAGNOSIS — K7011 Alcoholic hepatitis with ascites: Secondary | ICD-10-CM | POA: Diagnosis present

## 2015-06-12 DIAGNOSIS — K7031 Alcoholic cirrhosis of liver with ascites: Secondary | ICD-10-CM | POA: Diagnosis present

## 2015-06-12 DIAGNOSIS — Z803 Family history of malignant neoplasm of breast: Secondary | ICD-10-CM

## 2015-06-12 DIAGNOSIS — E785 Hyperlipidemia, unspecified: Secondary | ICD-10-CM | POA: Diagnosis present

## 2015-06-12 DIAGNOSIS — R64 Cachexia: Secondary | ICD-10-CM | POA: Diagnosis present

## 2015-06-12 DIAGNOSIS — F1010 Alcohol abuse, uncomplicated: Secondary | ICD-10-CM | POA: Diagnosis not present

## 2015-06-12 DIAGNOSIS — Z886 Allergy status to analgesic agent status: Secondary | ICD-10-CM | POA: Diagnosis not present

## 2015-06-12 DIAGNOSIS — Z7952 Long term (current) use of systemic steroids: Secondary | ICD-10-CM | POA: Diagnosis not present

## 2015-06-12 DIAGNOSIS — D638 Anemia in other chronic diseases classified elsewhere: Secondary | ICD-10-CM | POA: Diagnosis present

## 2015-06-12 DIAGNOSIS — Z6822 Body mass index (BMI) 22.0-22.9, adult: Secondary | ICD-10-CM | POA: Diagnosis not present

## 2015-06-12 DIAGNOSIS — E872 Acidosis: Secondary | ICD-10-CM | POA: Diagnosis present

## 2015-06-12 DIAGNOSIS — Z8249 Family history of ischemic heart disease and other diseases of the circulatory system: Secondary | ICD-10-CM

## 2015-06-12 DIAGNOSIS — I959 Hypotension, unspecified: Secondary | ICD-10-CM | POA: Diagnosis present

## 2015-06-12 DIAGNOSIS — R609 Edema, unspecified: Secondary | ICD-10-CM

## 2015-06-12 DIAGNOSIS — F1020 Alcohol dependence, uncomplicated: Secondary | ICD-10-CM | POA: Diagnosis present

## 2015-06-12 DIAGNOSIS — Z91030 Bee allergy status: Secondary | ICD-10-CM

## 2015-06-12 DIAGNOSIS — Z79899 Other long term (current) drug therapy: Secondary | ICD-10-CM

## 2015-06-12 LAB — CREATININE, SERUM
Creatinine, Ser: 2.49 mg/dL — ABNORMAL HIGH (ref 0.44–1.00)
GFR calc non Af Amer: 20 mL/min — ABNORMAL LOW (ref >60)
GFR, EST AFRICAN AMERICAN: 23 mL/min — AB (ref >60)

## 2015-06-12 LAB — AMMONIA: AMMONIA: 52 umol/L — AB (ref 9–35)

## 2015-06-12 MED ORDER — ONDANSETRON HCL 4 MG/2ML IJ SOLN
4.0000 mg | Freq: Four times a day (QID) | INTRAMUSCULAR | Status: DC | PRN
  Administered 2015-06-17: 4 mg via INTRAVENOUS
  Filled 2015-06-12: qty 2

## 2015-06-12 MED ORDER — ADULT MULTIVITAMIN W/MINERALS CH
1.0000 | ORAL_TABLET | Freq: Every day | ORAL | Status: DC
  Administered 2015-06-13 – 2015-06-18 (×6): 1 via ORAL
  Filled 2015-06-12 (×7): qty 1

## 2015-06-12 MED ORDER — OXYCODONE HCL 5 MG PO TABS: 5.0000 mg | ORAL_TABLET | ORAL | Status: DC | PRN

## 2015-06-12 MED ORDER — PIPERACILLIN-TAZOBACTAM 3.375 G IVPB
3.3750 g | Freq: Three times a day (TID) | INTRAVENOUS | Status: DC
  Administered 2015-06-12 – 2015-06-18 (×18): 3.375 g via INTRAVENOUS
  Filled 2015-06-12 (×19): qty 50

## 2015-06-12 MED ORDER — PIPERACILLIN-TAZOBACTAM 3.375 G IVPB
3.3750 g | INTRAVENOUS | Status: DC
  Filled 2015-06-12 (×2): qty 50

## 2015-06-12 MED ORDER — SPIRONOLACTONE 25 MG PO TABS
50.0000 mg | ORAL_TABLET | Freq: Every day | ORAL | Status: DC
  Administered 2015-06-13 – 2015-06-19 (×7): 50 mg via ORAL
  Filled 2015-06-12 (×7): qty 2

## 2015-06-12 MED ORDER — LEVOTHYROXINE SODIUM 75 MCG PO TABS
75.0000 ug | ORAL_TABLET | Freq: Every day | ORAL | Status: DC
  Administered 2015-06-13 – 2015-06-19 (×7): 75 ug via ORAL
  Filled 2015-06-12 (×7): qty 1

## 2015-06-12 MED ORDER — PANTOPRAZOLE SODIUM 40 MG PO TBEC
40.0000 mg | DELAYED_RELEASE_TABLET | Freq: Every day | ORAL | Status: DC
  Administered 2015-06-13 – 2015-06-19 (×7): 40 mg via ORAL
  Filled 2015-06-12 (×7): qty 1

## 2015-06-12 MED ORDER — ONDANSETRON HCL 4 MG PO TABS: 4.0000 mg | ORAL_TABLET | Freq: Four times a day (QID) | ORAL | Status: DC | PRN

## 2015-06-12 NOTE — Progress Notes (Signed)
ANTIBIOTIC CONSULT NOTE - INITIAL  Pharmacy Consult for Zosyn Indication: LE ulcers  Allergies  Allergen Reactions  . Bee Venom Anaphylaxis  . Advil [Ibuprofen] Nausea And Vomiting  . Aleve [Naproxen Sodium] Nausea And Vomiting  . Betadine [Povidone Iodine] Other (See Comments)    Reaction:  Burning   . Codeine Other (See Comments)    Reaction:  GI upset     Patient Measurements: Height: 5\' 4"  (162.6 cm) Weight: 130 lb 6.4 oz (59.149 kg) IBW/kg (Calculated) : 54.7  Vital Signs: Temp: 98.5 F (36.9 C) (01/24 1342) Temp Source: Oral (01/24 1342) BP: 127/62 mmHg (01/24 1342) Pulse Rate: 75 (01/24 1342) Intake/Output from previous day:   Intake/Output from this shift:    Labs: No results for input(s): WBC, HGB, PLT, LABCREA, CREATININE in the last 72 hours. CrCl cannot be calculated (Patient has no serum creatinine result on file.). No results for input(s): VANCOTROUGH, VANCOPEAK, VANCORANDOM, GENTTROUGH, GENTPEAK, GENTRANDOM, TOBRATROUGH, TOBRAPEAK, TOBRARND, AMIKACINPEAK, AMIKACINTROU, AMIKACIN in the last 72 hours.   Microbiology: Recent Results (from the past 720 hour(s))  Culture, blood (routine x 2)     Status: None (Preliminary result)   Collection Time: 06/08/15  4:16 PM  Result Value Ref Range Status   Specimen Description BLOOD LEFT ARM  Final   Special Requests   Final    BOTTLES DRAWN AEROBIC AND ANAEROBIC 2CCAEROB,3CCANA   Culture NO GROWTH 4 DAYS  Final   Report Status PENDING  Incomplete  Culture, blood (routine x 2)     Status: None (Preliminary result)   Collection Time: 06/08/15  4:16 PM  Result Value Ref Range Status   Specimen Description BLOOD LEFT ASSIST CONTROL  Final   Special Requests   Final    BOTTLES DRAWN AEROBIC AND ANAEROBIC 5CCAEROBIC,2CCANA   Culture NO GROWTH 4 DAYS  Final   Report Status PENDING  Incomplete    Medical History: Past Medical History  Diagnosis Date  . Insomnia   . Generalized anxiety disorder   . Other and  unspecified hyperlipidemia   . Unspecified hypothyroidism   . Alcohol abuse, episodic   . Eczema herpeticum     Medications:  Scheduled:  . [START ON 06/13/2015] levothyroxine  75 mcg Oral QAC breakfast  . [START ON 06/13/2015] multivitamin with minerals  1 tablet Oral Daily  . pantoprazole  40 mg Oral Daily  . piperacillin-tazobactam (ZOSYN)  IV  3.375 g Intravenous Q8H  . spironolactone  50 mg Oral Daily   Infusions:    Assessment: 65 y/o F admitted with LE edema and ulcers ordered empiric abx. SCr cannot be measured due to icterus.   Plan:  No h/o abnormal renal function so will dose Zosyn 3.375 g EI q 8 hours.   Anne Hickman D 06/12/2015,7:46 PM

## 2015-06-12 NOTE — H&P (Signed)
[facility] NAME: Anne Hickman    MR#:  [id number]  DATE OF BIRTH:  1950/12/07  DATE OF ADMISSION:  06/12/2015  PRIMARY CARE PHYSICIAN: Maryland Pink, MD   REQUESTING/REFERRING PHYSICIAN: Dr Tiffany Kocher  CHIEF COMPLAINT:  Increasing leg edema, tremors  HISTORY OF PRESENT ILLNESS:  Anne Hickman  is a 65 y.o. 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 Percell [place] office with increasing leg edema and tremors Patient denies any fever. She has developed significant leg edema since her discharge 05/31/2015 Patient also has developed superficial ulcers over her lower extremity Denies any weight loss. She appears severely jaundiced  PAST MEDICAL HISTORY:   Past Medical History  Diagnosis Date  . Insomnia   . Generalized anxiety disorder   . Other and unspecified hyperlipidemia   . Unspecified hypothyroidism   . Alcohol abuse, episodic   . Eczema herpeticum     PAST SURGICAL HISTOIRY:   Past Surgical History  Procedure Laterality Date  . Appendectomy  1978  . Abdominal hysterectomy      Partial   . Esophagogastroduodenoscopy (egd) with propofol N/A 12/13/2014    Procedure: ESOPHAGOGASTRODUODENOSCOPY (EGD) WITH PROPOFOL;  Surgeon: Manya Silvas, MD;  Location: [facility];  Service: Endoscopy;  Laterality: N/A;    SOCIAL HISTORY:   Social History  Substance Use Topics  . Smoking status: Never Smoker   . Smokeless tobacco: Never Used  . Alcohol Use: No     Comment: 1- 2 bottles of wine a day    FAMILY HISTORY:   Family History  Problem Relation Age of Onset  . Coronary artery disease Father   . Heart disease Father   . Breast cancer Maternal Aunt 60  . Mental illness Mother     DRUG ALLERGIES:   Allergies  Allergen Reactions  . Bee Venom Anaphylaxis  . Advil [Ibuprofen] Nausea And Vomiting  . Aleve [Naproxen  Sodium] Nausea And Vomiting  . Betadine [Povidone Iodine] Other (See Comments)    Reaction:  Burning   . Codeine Other (See Comments)    Reaction:  GI upset     REVIEW OF SYSTEMS:  Review of Systems  Constitutional: Negative for fever, chills and weight loss.  HENT: Negative for ear discharge, ear pain and nosebleeds.        Jaundice  Eyes: Negative for blurred vision, pain and discharge.  Respiratory: Negative for sputum production, shortness of breath, wheezing and stridor.   Cardiovascular: Positive for leg swelling. Negative for chest pain, palpitations, orthopnea and PND.  Gastrointestinal: Negative for nausea, vomiting, abdominal pain and diarrhea.  Genitourinary: Negative for urgency and frequency.  Musculoskeletal: Negative for back pain and joint pain.  Neurological: Positive for weakness. Negative for sensory change, speech change and focal weakness.  Psychiatric/Behavioral: Negative for depression and hallucinations. The patient is not nervous/anxious.      MEDICATIONS AT HOME:   Prior to Admission medications   Medication Sig Start Date End Date Taking? Authorizing Provider  levothyroxine (SYNTHROID, LEVOTHROID) 75 MCG tablet Take 75 mcg by mouth daily before breakfast.    Historical Provider, MD  Multiple Vitamin (MULTIVITAMIN WITH MINERALS) TABS tablet Take 1 tablet by mouth daily. 06/02/15   Fritzi Mandes, MD  ondansetron (ZOFRAN) 4 MG tablet Take 1 tablet (4 mg total) by mouth every 8 (eight) hours as needed for nausea or vomiting. Patient not taking: Reported on 05/30/2015  05/20/15   Victorino Dike, FNP  pantoprazole (PROTONIX) 40 MG tablet Take 40 mg by mouth daily.    Historical Provider, MD  predniSONE (DELTASONE) 20 MG tablet Take 2 tablets (40 mg total) by mouth daily with breakfast. 06/02/15   Fritzi Mandes, MD  spironolactone (ALDACTONE) 50 MG tablet Take 50 mg by mouth daily.    Historical Provider, MD  traMADol (ULTRAM) 50 MG tablet Take 1 tablet (50 mg total) by  mouth every 6 (six) hours as needed. Patient not taking: Reported on 05/30/2015 05/20/15   Victorino Dike, FNP      VITAL SIGNS:  Blood pressure 127/62, pulse 75, temperature 98.5 F (36.9 C), temperature source Oral, resp. rate 16, height 5\' 4"  (1.626 m), weight 59.149 kg (130 lb 6.4 oz), SpO2 100 %.  PHYSICAL EXAMINATION:  GENERAL:  65 y.o.-year-old patient lying in the bed with no acute distress. Severely malnourished cachectic EYES: Pupils equal, round, reactive to light and accommodation. Severe scleral icterus. Extraocular muscles intact.  HEENT: Head atraumatic, normocephalic. Oropharynx and nasopharynx clear.  NECK:  Supple, no jugular venous distention. No thyroid enlargement, no tenderness.  LUNGS: Normal breath sounds bilaterally, no wheezing, rales,rhonchi or crepitation. No use of accessory muscles of respiration.  CARDIOVASCULAR: S1, S2 normal. No murmurs, rubs, or gallops.  ABDOMEN: Soft, nontender, mildly distended. Bowel sounds present. No organomegaly or mass.  EXTREMITIES: 3+ pedal edema, no cyanosis, or clubbing.  NEUROLOGIC: Cranial nerves II through XII are intact. Muscle strength 5/5 in all extremities. Sensation intact. Gait not checked. Tremors plus PSYCHIATRIC: The patient is alert and oriented x 3.  SKIN: No obvious rash, lesion. Superficial ulcer over the left lower extremity.   LABORATORY PANEL:   CBC  Recent Labs Lab 06/08/15 1533  WBC 25.3*  HGB 8.2*  HCT 24.0*  PLT 260   ------------------------------------------------------------------------------------------------------------------  Chemistries   Recent Labs Lab 06/08/15 1616  NA 132*  K 4.3  CL 101  CO2 18*  GLUCOSE 166*  BUN 99*  CREATININE NOT VALID  CALCIUM 8.6*  AST 124*  ALT 72*  ALKPHOS 148*  BILITOT 22.0*   ------------------------------------------------------------------------------------------------------------------  Cardiac Enzymes  Recent Labs Lab 06/08/15 1616   TROPONINI 0.04*   ------------------------------------------------------------------------------------------------------------------  RADIOLOGY:  Dg Chest Port 1 View  06/12/2015  CLINICAL DATA:  PICC line placement.  Cirrhosis. EXAM: PORTABLE CHEST 1 VIEW COMPARISON:  06/08/2015 FINDINGS: A new right jugular PICC line is seen with tip overlying the superior cavoatrial junction. New linear opacity is seen in the left lung base, consistent with subsegmental atelectasis. No evidence of pulmonary consolidation or edema. Mild cardiomegaly and tortuosity of thoracic aorta is stable. IMPRESSION: New right jugular PICC line tip overlies the superior cavoatrial junction. Mild left basilar atelectasis. Electronically Signed   By: Earle Gell M.D.   On: 06/12/2015 17:25    EKG:    IMPRESSION AND PLAN:    Anne Hickman  is a 65 y.o. 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 Percell [place] office with increasing leg edema and tremors Patient denies any fever. She has developed significant leg edema since her discharge 05/31/2015 Patient also has developed superficial ulcers over her lower extremity Denies any weight loss.  1. severe bilateral lower extremity edema with superficial ulcers and elevated white count -Admit to medical floor -Continue spironolactone -Seen by Dr. Vira Agar recommends Midodrin and albumin. Will defer it for Dr. Vira Agar to put those orders in -  Blood cultures 2 -Empiric IV Zosyn -White count would have been elevated secondary to by mouth steroids which patient recently was taking for alcoholic hepatitis/cirrhosis. She completed the course -No fever -Monitor WBC count  2. Severe alcoholic liver cirrhosis -Followed by Dr. Vira Agar  3. 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 Hypothyroidism continue Synthroid  5. DVT prophylaxis none  since patient has acquired coagulopathy  Above was discussed with Dr. Vira Agar  All the records are reviewed and case discussed with ED provider. Management plans discussed with the patient, family and they are in agreement.  CODE STATUS: Full  TOTAL TIME TAKING CARE OF THIS PATIENT: 50 minutes.    Anne Hickman M.D on 06/12/2015 at 5:48 PM  Between 7am to 6pm - Pager - [phone]  After 6pm go to [email or web address] - password [email or web address] [facility]  [facility]  Office  [phone]  CC: Primary care physician; Maryland Pink, MD

## 2015-06-12 NOTE — Consult Note (Signed)
Pt is 65 y/o WF with hx of severe liver disease due to alcoholism who has developed signif leg edema and very high BUN, over 100.  I am greatly concerned she is developing hepatorenal syndrome and recommended admission and hydration.   She likely would benefit from albumin and midodrine.  She has leukocytosis and a marked shift with metamyelocytes.  WBC are 25,000 and 12 days ago it was 10,000.  Would draw blood cultures and start empiric antibiotics, probably zosyn.  Will discuss with hospitalist.

## 2015-06-13 LAB — HEPATIC FUNCTION PANEL
ALK PHOS: 123 U/L (ref 38–126)
ALT: 97 U/L — ABNORMAL HIGH (ref 14–54)
AST: 112 U/L — AB (ref 15–41)
Albumin: 1.9 g/dL — ABNORMAL LOW (ref 3.5–5.0)
BILIRUBIN DIRECT: 8 mg/dL — AB (ref 0.1–0.5)
BILIRUBIN TOTAL: 16.3 mg/dL — AB (ref 0.3–1.2)
Indirect Bilirubin: 8.3 mg/dL — ABNORMAL HIGH (ref 0.3–0.9)
Total Protein: 6.8 g/dL (ref 6.5–8.1)

## 2015-06-13 LAB — CULTURE, BLOOD (ROUTINE X 2)
Culture: NO GROWTH
Culture: NO GROWTH

## 2015-06-13 LAB — BASIC METABOLIC PANEL
ANION GAP: 10 (ref 5–15)
BUN: 108 mg/dL — ABNORMAL HIGH (ref 6–20)
CO2: 19 mmol/L — AB (ref 22–32)
Calcium: 8.1 mg/dL — ABNORMAL LOW (ref 8.9–10.3)
Chloride: 101 mmol/L (ref 101–111)
Creatinine, Ser: 2.42 mg/dL — ABNORMAL HIGH (ref 0.44–1.00)
GFR calc Af Amer: 24 mL/min — ABNORMAL LOW (ref >60)
GFR, EST NON AFRICAN AMERICAN: 20 mL/min — AB (ref >60)
GLUCOSE: 101 mg/dL — AB (ref 65–99)
Potassium: 4 mmol/L (ref 3.5–5.1)
Sodium: 130 mmol/L — ABNORMAL LOW (ref 135–145)

## 2015-06-13 MED ORDER — OXYCODONE HCL 5 MG PO TABS: 5.0000 mg | ORAL_TABLET | ORAL | Status: DC | PRN

## 2015-06-13 MED ORDER — SODIUM CHLORIDE 0.9 % IV SOLN
INTRAVENOUS | Status: DC
  Administered 2015-06-13 – 2015-06-15 (×5): via INTRAVENOUS

## 2015-06-13 MED ORDER — MIDODRINE HCL 5 MG PO TABS
10.0000 mg | ORAL_TABLET | Freq: Three times a day (TID) | ORAL | Status: DC
  Administered 2015-06-13 – 2015-06-19 (×19): 10 mg via ORAL
  Filled 2015-06-13 (×19): qty 2

## 2015-06-13 MED ORDER — ALBUMIN HUMAN 25 % IV SOLN
12.5000 g | Freq: Once | INTRAVENOUS | Status: AC
  Administered 2015-06-13: 12.5 g via INTRAVENOUS
  Filled 2015-06-13: qty 50

## 2015-06-13 NOTE — Clinical Social Work Note (Signed)
Nursing placed CSW consult for possible need for placement due to falls at home. CSW requested a PT consult today to better assess patient's mobility and for recommendations. Shela Leff MSW,LCSW [phone]

## 2015-06-13 NOTE — Progress Notes (Signed)
PT Cancellation Note  Patient Details Name: Anne Hickman MRN: [id number] DOB: [birth year]   Cancelled Treatment:    Reason Eval/Treat Not Completed: Patient declined, no reason specified. Chart reviewed and RN consulted. Attempted to evaluate pt however she refuses at this time. Pt states that she just got settled in bed and doesn't want to get up. Husband encourages pt to participate however pt continues to refuse. Pt agrees to PT evaluation tomorrow. Will attempt PT evaluation tomorrow as pt is appropriate and willing to participate.  Lyndel Safe Bobby Barton PT, DPT   Mckaylin Bastien 06/13/2015, 5:02 PM

## 2015-06-13 NOTE — Consult Note (Signed)
Patient severe alcoholic admitted with renal failure and liver failure with deep jaundice, leukocytosis with premature WBC's, edema and ulcerations of lower ext.  Pt got central line put in.  Pt eating well.  She is alert and oriented, able to carry on a conversation.  VSS afebrile. Chest clear, heart 1-2 /6 SEM.  Started midodrine and albumin infusion, awaiting blood cultures.  Bilirubin down some today.   Will follow with you.

## 2015-06-13 NOTE — Care Management (Signed)
Patient admitted with bilateral extremity edema and severe alcoholic liver cirrhosis.Patient lives at home with her husband.  Patient states she obtains her medications from [facility] in [place].  Patient states the only medical equipment she has in the home is a bedpan.  Patient is currently open with [facility].  Dolly Harbach from [facility] notified of admission.  Patient expresses concerns of increased weakness. PT consult pending. RNCM following

## 2015-06-13 NOTE — Consult Note (Signed)
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  Nonpitting edema to left leg and dorsal foot.  Erythema, increased warmth and serous weeping to left lower leg.  Right lower extremity has scarring on medial aspect from wound that healed without incident, per patient.   Wound type:Chronic nonhealing wound.   Pressure Ulcer POA: N/A Measurement: 2.2 cm x 1.4 cm x 0.3 cm  Wound bed:50% fibrin slough, 50% pale pink nongranulating tissue.   Drainage (amount, consistency, odor) Scant serous weeping Periwound:Erythema, induration and edema.  Dressing procedure/placement/frequency:Cleanse wound with NS and pat gently dry.  Apply Aquacel AG to wound bed, moistened with NS.  Cover with 4x4 gauze and wrap with kerlix.  Change daily.  [place] [facility] team will follow.  Once DVT has been ruled out, will reassess for need for compression.  Domenic Moras RN BSN [place] Pager [phone]

## 2015-06-13 NOTE — Progress Notes (Signed)
ANTIBIOTIC CONSULT NOTE - INITIAL  Pharmacy Consult for Zosyn Indication: LE ulcers  Allergies  Allergen Reactions  . Bee Venom Anaphylaxis  . Advil [Ibuprofen] Nausea And Vomiting  . Aleve [Naproxen Sodium] Nausea And Vomiting  . Betadine [Povidone Iodine] Other (See Comments)    Reaction:  Burning   . Codeine Other (See Comments)    Reaction:  GI upset     Patient Measurements: Height: 5\' 4"  (162.6 cm) Weight: 130 lb 6.4 oz (59.149 kg) IBW/kg (Calculated) : 54.7  Vital Signs: Temp: 97.5 F (36.4 C) (01/25 0440) Temp Source: Oral (01/25 0440) BP: 122/59 mmHg (01/25 0440) Pulse Rate: 80 (01/25 0440) Intake/Output from previous day: 01/24 0701 - 01/25 0700 In: 480 [P.O.:480] Out: 800 [Urine:800] Intake/Output from this shift:    Labs:  Recent Labs  06/12/15 1858 06/13/15 0547  CREATININE 2.49* 2.42*   Estimated Creatinine Clearance: 20.8 mL/min (by C-G formula based on Cr of 2.42). No results for input(s): VANCOTROUGH, VANCOPEAK, VANCORANDOM, GENTTROUGH, GENTPEAK, GENTRANDOM, TOBRATROUGH, TOBRAPEAK, TOBRARND, AMIKACINPEAK, AMIKACINTROU, AMIKACIN in the last 72 hours.   Microbiology: Recent Results (from the past 720 hour(s))  Culture, blood (routine x 2)     Status: None (Preliminary result)   Collection Time: 06/08/15  4:16 PM  Result Value Ref Range Status   Specimen Description BLOOD LEFT ARM  Final   Special Requests   Final    BOTTLES DRAWN AEROBIC AND ANAEROBIC 2CCAEROB,3CCANA   Culture NO GROWTH 4 DAYS  Final   Report Status PENDING  Incomplete  Culture, blood (routine x 2)     Status: None (Preliminary result)   Collection Time: 06/08/15  4:16 PM  Result Value Ref Range Status   Specimen Description BLOOD LEFT ASSIST CONTROL  Final   Special Requests   Final    BOTTLES DRAWN AEROBIC AND ANAEROBIC 5CCAEROBIC,2CCANA   Culture NO GROWTH 4 DAYS  Final   Report Status PENDING  Incomplete    Medical History: Past Medical History  Diagnosis Date  .  Insomnia   . Generalized anxiety disorder   . Other and unspecified hyperlipidemia   . Unspecified hypothyroidism   . Alcohol abuse, episodic   . Eczema herpeticum     Medications:  Scheduled:  . levothyroxine  75 mcg Oral QAC breakfast  . multivitamin with minerals  1 tablet Oral Daily  . pantoprazole  40 mg Oral Daily  . piperacillin-tazobactam (ZOSYN)  IV  3.375 g Intravenous Q8H  . spironolactone  50 mg Oral Daily   Infusions:    Assessment: 65 y/o F admitted with LE edema and ulcers ordered empiric abx. Ordered a follow up SCr, but cannot be measured due to icterus.   Plan:  The current crcl calculation is unreliable. It is borderline need for dose reduction. I Will continue with Zosyn 3.375 g EI q 8 hours. Continue to monitor.   Maxon Kresse D Khyron Garno 06/13/2015,8:01 AM

## 2015-06-13 NOTE — Progress Notes (Signed)
Initial Nutrition Assessment     INTERVENTION:  Meals and snacks: Cater to pt preferences. Recommend 2 gm Na diet secondary to pt PMH. Medical Nutrition Supplement: If unable to meet nutritional needs will add supplement   NUTRITION DIAGNOSIS:   Increased nutrient needs related to chronic illness as evidenced by estimated needs.    GOAL:   Patient will meet greater than or equal to 90% of their needs    MONITOR:    (Energy intake, Electrolyte and renal profile)  REASON FOR ASSESSMENT:   Diagnosis    ASSESSMENT:      Pt admitted with ARF, leg edema with wounds, hyponatermia  Past Medical History  Diagnosis Date  . Insomnia   . Generalized anxiety disorder   . Other and unspecified hyperlipidemia   . Unspecified hypothyroidism   . Alcohol abuse, episodic   . Eczema herpeticum     Current Nutrition: ate 100% of breakfast this am (juice, cereal, yogurt)  Food/Nutrition-Related History: Pt reports intake has improved since last admission and has been eating well   Scheduled Medications:  . levothyroxine  75 mcg Oral QAC breakfast  . multivitamin with minerals  1 tablet Oral Daily  . pantoprazole  40 mg Oral Daily  . piperacillin-tazobactam (ZOSYN)  IV  3.375 g Intravenous Q8H  . spironolactone  50 mg Oral Daily       Electrolyte/Renal Profile and Glucose Profile:   Recent Labs Lab 06/08/15 1616 06/12/15 1858 06/13/15 0547  NA 132*  --  130*  K 4.3  --  4.0  CL 101  --  101  CO2 18*  --  19*  BUN 99*  --  108*  CREATININE NOT VALID 2.49* 2.42*  CALCIUM 8.6*  --  8.1*  GLUCOSE 166*  --  101*   Protein Profile:  Recent Labs Lab 06/08/15 1616  ALBUMIN 2.0*    Gastrointestinal Profile: Last BM: 1/24   Nutrition-Focused Physical Exam Findings:  Unable to complete Nutrition-Focused physical exam at this time.     Weight Change: wt increase from last admission (? Fluid)  Wt Readings from Last 10 Encounters:  06/12/15 130 lb 6.4 oz  (59.149 kg)  06/08/15 122 lb (55.339 kg)  05/30/15 122 lb 1.6 oz (55.384 kg)  05/27/15 138 lb (62.596 kg)  05/20/15 138 lb (62.596 kg)  02/19/15 130 lb (58.968 kg)  12/13/14 139 lb (63.05 kg)  11/17/14 143 lb 1.3 oz (64.9 kg)  10/12/14 144 lb 1.1 oz (65.35 kg)  07/06/14 158 lb 6.4 oz (71.85 kg)      Diet Order:  DIET SOFT Room service appropriate?: Yes; Fluid consistency:: Thin  Skin:   ulcer reviewed on lower extremities    Height:   Ht Readings from Last 1 Encounters:  06/12/15 5\' 4"  (1.626 m)    Weight:   Wt Readings from Last 1 Encounters:  06/12/15 130 lb 6.4 oz (59.149 kg)    Ideal Body Weight:     BMI:  Body mass index is 22.37 kg/(m^2).  Estimated Nutritional Needs:   Kcal:  BEE 1135 kcals (IF 1.1-1.3, AF 1.3) [id number] kcals/d  Protein:  65-77 g/d (1.1-1.3 g/kg)  Fluid:  1475-1750ml/d (25-64ml/kg)   EDUCATION NEEDS:   No education needs identified at this time  [place]. Zenia Resides, [place], [place] (pager) Weekend/On-Call pager [phone])

## 2015-06-14 ENCOUNTER — Inpatient Hospital Stay: Payer: BLUE CROSS/BLUE SHIELD

## 2015-06-14 LAB — CREATININE, SERUM
CREATININE: 2.14 mg/dL — AB (ref 0.44–1.00)
GFR, EST AFRICAN AMERICAN: 27 mL/min — AB (ref >60)
GFR, EST NON AFRICAN AMERICAN: 24 mL/min — AB (ref >60)

## 2015-06-14 MED ORDER — ALBUMIN HUMAN 25 % IV SOLN
12.5000 g | Freq: Once | INTRAVENOUS | Status: AC
  Administered 2015-06-14: 12.5 g via INTRAVENOUS
  Filled 2015-06-14: qty 50

## 2015-06-14 NOTE — Progress Notes (Signed)
[place] [facility] [place] NAME: Jeaneth Reckling    MR#:  [id number]  DATE OF BIRTH:  [birth year]  SUBJECTIVE:  CHIEF COMPLAINT:  No chief complaint on file.   Sent by Dr. Tiffany Kocher from clinic due to swelling on legs and redness.   She has liver cirrhosis and was recently admitted and discharged with prednisone.   LFT remains high, pt is alert and oriented.   Her husband and son present in room.  REVIEW OF SYSTEMS:  CONSTITUTIONAL: No fever, fatigue or weakness.  EYES: No blurred or double vision.  EARS, NOSE, AND THROAT: No tinnitus or ear pain.  RESPIRATORY: No cough, shortness of breath, wheezing or hemoptysis.  CARDIOVASCULAR: No chest pain, orthopnea, edema.  GASTROINTESTINAL: No nausea, vomiting, diarrhea or abdominal pain.  GENITOURINARY: No dysuria, hematuria.  ENDOCRINE: No polyuria, nocturia,  HEMATOLOGY: No anemia, easy bruising or bleeding SKIN: No rash or lesion. MUSCULOSKELETAL: No joint pain or arthritis.   NEUROLOGIC: No tingling, numbness, weakness.  PSYCHIATRY: No anxiety or depression.   ROS  DRUG ALLERGIES:   Allergies  Allergen Reactions  . Bee Venom Anaphylaxis  . Advil [Ibuprofen] Nausea And Vomiting  . Aleve [Naproxen Sodium] Nausea And Vomiting  . Betadine [Povidone Iodine] Other (See Comments)    Reaction:  Burning   . Codeine Other (See Comments)    Reaction:  GI upset     VITALS:  Blood pressure 112/54, pulse 76, temperature 97.5 F (36.4 C), temperature source Oral, resp. rate 20, height 5\' 4"  (1.626 m), weight 59.149 kg (130 lb 6.4 oz), SpO2 100 %.  PHYSICAL EXAMINATION:  GENERAL:  65 y.o.-year-old patient lying in the bed with no acute distress.  EYES: Pupils equal, round, reactive to light and accommodation. No scleral icterus. Extraocular muscles intact.  HEENT: Head atraumatic, normocephalic. Oropharynx and nasopharynx clear.  NECK:  Supple, no jugular venous distention. No thyroid  enlargement, no tenderness.  LUNGS: Normal breath sounds bilaterally, no wheezing, rales,rhonchi or crepitation. No use of accessory muscles of respiration.  CARDIOVASCULAR: S1, S2 normal. No murmurs, rubs, or gallops.  ABDOMEN: Soft, mild tender, some distended. Bowel sounds present. No organomegaly or mass.  EXTREMITIES: legs are res and swollen.  NEUROLOGIC: Cranial nerves II through XII are intact. Muscle strength 4/5 in all extremities. Sensation intact. Gait not checked.  PSYCHIATRIC: The patient is alert and oriented x 3.  SKIN: No obvious rash, lesion, or ulcer.   Physical Exam LABORATORY PANEL:   CBC  Recent Labs Lab 06/08/15 1533  WBC 25.3*  HGB 8.2*  HCT 24.0*  PLT 260   ------------------------------------------------------------------------------------------------------------------  Chemistries   Recent Labs Lab 06/13/15 0547 06/14/15 0610  NA 130*  --   K 4.0  --   CL 101  --   CO2 19*  --   GLUCOSE 101*  --   BUN 108*  --   CREATININE 2.42* 2.14*  CALCIUM 8.1*  --   AST 112*  --   ALT 97*  --   ALKPHOS 123  --   BILITOT 16.3*  --    ------------------------------------------------------------------------------------------------------------------  Cardiac Enzymes  Recent Labs Lab 06/08/15 1616  TROPONINI 0.04*   ------------------------------------------------------------------------------------------------------------------  RADIOLOGY:  US Venous Img Lower Bilateral  06/14/2015  CLINICAL DATA:  Bilateral lower extremity pain and edema. Evaluate for DVT. EXAM: BILATERAL LOWER EXTREMITY VENOUS DOPPLER ULTRASOUND TECHNIQUE: Gray-scale sonography with graded compression, as well as color Doppler and duplex ultrasound were performed to evaluate the  lower extremity deep venous systems from the level of the common femoral vein and including the common femoral, femoral, profunda femoral, popliteal and calf veins including the posterior tibial, peroneal  and gastrocnemius veins when visible. The superficial great saphenous vein was also interrogated. Spectral Doppler was utilized to evaluate flow at rest and with distal augmentation maneuvers in the common femoral, femoral and popliteal veins. COMPARISON:  None. FINDINGS: RIGHT LOWER EXTREMITY Common Femoral Vein: No evidence of thrombus. Normal compressibility, respiratory phasicity and response to augmentation. Saphenofemoral Junction: No evidence of thrombus. Normal compressibility and flow on color Doppler imaging. Profunda Femoral Vein: No evidence of thrombus. Normal compressibility and flow on color Doppler imaging. Femoral Vein: No evidence of thrombus. Normal compressibility, respiratory phasicity and response to augmentation. Popliteal Vein: No evidence of thrombus. Normal compressibility, respiratory phasicity and response to augmentation. Calf Veins: No evidence of thrombus. Normal compressibility and flow on color Doppler imaging. Superficial Great Saphenous Vein: No evidence of thrombus. Normal compressibility and flow on color Doppler imaging. Venous Reflux:  None. Other Findings: A minimal amount of subcutaneous edema is noted within the right lower extremity. LEFT LOWER EXTREMITY Common Femoral Vein: No evidence of thrombus. Normal compressibility, respiratory phasicity and response to augmentation. Saphenofemoral Junction: No evidence of thrombus. Normal compressibility and flow on color Doppler imaging. Profunda Femoral Vein: No evidence of thrombus. Normal compressibility and flow on color Doppler imaging. Femoral Vein: No evidence of thrombus. Normal compressibility, respiratory phasicity and response to augmentation. Popliteal Vein: No evidence of thrombus. Normal compressibility, respiratory phasicity and response to augmentation. Calf Veins: No evidence of thrombus. Normal compressibility and flow on color Doppler imaging. Superficial Great Saphenous Vein: No evidence of thrombus. Normal  compressibility and flow on color Doppler imaging. Venous Reflux:  None. Other Findings: A minimal to moderate amount of subcutaneous edema is noted within the left lower extremity. IMPRESSION: No evidence of DVT within either lower extremity. Electronically Signed   By: Sandi Mariscal M.D.   On: 06/14/2015 11:42    ASSESSMENT AND PLAN:   Active Problems:   Leg edema  1. severe bilateral lower extremity edema with superficial ulcers and elevated white count -Continue spironolactone -Seen by Dr. Vira Agar recommends Midodrin and albumin.  -Blood cultures 2 -Empiric IV Zosyn -White count would have been elevated secondary to by mouth steroids which patient recently was taking for alcoholic hepatitis/cirrhosis. She completed the course -No fever -Monitor WBC count - Negative DVT studies.  2. Severe alcoholic liver cirrhosis -Followed by Dr. Vira Agar - was on tapering steroids. - prognosis extremely poor- explained the family that.   Called palliative care consult to help d/c plan.  3. 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 Hypothyroidism continue Synthroid  5. DVT prophylaxis none since patient has acquired coagulopathy   INR is 2.8   All the records are reviewed and case discussed with Care Management/Social Workerr. Management plans discussed with the patient, family and they are in agreement.  CODE STATUS: FUll.  TOTAL TIME TAKING CARE OF THIS PATIENT: 35 minutes.    POSSIBLE D/C IN 2-3 DAYS, DEPENDING ON CLINICAL CONDITION.   Vaughan Basta M.D on 06/14/2015   Between 7am to 6pm - Pager - [phone]  After 6pm go to [email or web address] - password [email or web address] [facility]  Office  [phone]  CC: Primary care physician; [place] Pink, MD  Note: This dictation was prepared with Dragon dictation along with smaller phrase technology. Any transcriptional errors  that result from this process are  unintentional.

## 2015-06-14 NOTE — Progress Notes (Signed)
PT Cancellation Note  Patient Details Name: Anne Hickman MRN: [id number] DOB: [birth year]   Cancelled Treatment:    Reason Eval/Treat Not Completed: Patient at procedure or test/unavailable. Chart reviewed and RN consulted. Pt off unit for LE doppler to r/o DVT. Will await results and attempt to see pt when available/appropriate.  Lyndel Safe [name] PT, DPT   [name],[name] 06/14/2015, 11:02 AM

## 2015-06-14 NOTE — Progress Notes (Signed)
[place] [facility] [place] NAME: Anne Hickman    MR#:  [id number]  DATE OF BIRTH:  [birth year]  SUBJECTIVE:  CHIEF COMPLAINT:  No chief complaint on file.   Sent by Dr. Tiffany Kocher from clinic due to swelling on legs and redness.   She has liver cirrhosis and was recently admitted and discharged with prednisone.  REVIEW OF SYSTEMS:  CONSTITUTIONAL: No fever, fatigue or weakness.  EYES: No blurred or double vision.  EARS, NOSE, AND THROAT: No tinnitus or ear pain.  RESPIRATORY: No cough, shortness of breath, wheezing or hemoptysis.  CARDIOVASCULAR: No chest pain, orthopnea, edema.  GASTROINTESTINAL: No nausea, vomiting, diarrhea or abdominal pain.  GENITOURINARY: No dysuria, hematuria.  ENDOCRINE: No polyuria, nocturia,  HEMATOLOGY: No anemia, easy bruising or bleeding SKIN: No rash or lesion. MUSCULOSKELETAL: No joint pain or arthritis.   NEUROLOGIC: No tingling, numbness, weakness.  PSYCHIATRY: No anxiety or depression.   ROS  DRUG ALLERGIES:   Allergies  Allergen Reactions  . Bee Venom Anaphylaxis  . Advil [Ibuprofen] Nausea And Vomiting  . Aleve [Naproxen Sodium] Nausea And Vomiting  . Betadine [Povidone Iodine] Other (See Comments)    Reaction:  Burning   . Codeine Other (See Comments)    Reaction:  GI upset     VITALS:  Blood pressure 107/49, pulse 81, temperature 97.4 F (36.3 C), temperature source Oral, resp. rate 18, height 5\' 4"  (1.626 m), weight 59.149 kg (130 lb 6.4 oz), SpO2 100 %.  PHYSICAL EXAMINATION:  GENERAL:  65 y.o.-year-old patient lying in the bed with no acute distress.  EYES: Pupils equal, round, reactive to light and accommodation. No scleral icterus. Extraocular muscles intact.  HEENT: Head atraumatic, normocephalic. Oropharynx and nasopharynx clear.  NECK:  Supple, no jugular venous distention. No thyroid enlargement, no tenderness.  LUNGS: Normal breath sounds bilaterally, no wheezing, rales,rhonchi  or crepitation. No use of accessory muscles of respiration.  CARDIOVASCULAR: S1, S2 normal. No murmurs, rubs, or gallops.  ABDOMEN: Soft, mild tender, some distended. Bowel sounds present. No organomegaly or mass.  EXTREMITIES: legs are res and swollen.  NEUROLOGIC: Cranial nerves II through XII are intact. Muscle strength 5/5 in all extremities. Sensation intact. Gait not checked.  PSYCHIATRIC: The patient is alert and oriented x 3.  SKIN: No obvious rash, lesion, or ulcer.   Physical Exam LABORATORY PANEL:   CBC  Recent Labs Lab 06/08/15 1533  WBC 25.3*  HGB 8.2*  HCT 24.0*  PLT 260   ------------------------------------------------------------------------------------------------------------------  Chemistries   Recent Labs Lab 06/13/15 0547 06/14/15 0610  NA 130*  --   K 4.0  --   CL 101  --   CO2 19*  --   GLUCOSE 101*  --   BUN 108*  --   CREATININE 2.42* 2.14*  CALCIUM 8.1*  --   AST 112*  --   ALT 97*  --   ALKPHOS 123  --   BILITOT 16.3*  --    ------------------------------------------------------------------------------------------------------------------  Cardiac Enzymes  Recent Labs Lab 06/08/15 1616  TROPONINI 0.04*   ------------------------------------------------------------------------------------------------------------------  RADIOLOGY:  Dg Chest Port 1 View  06/12/2015  CLINICAL DATA:  PICC line placement.  Cirrhosis. EXAM: PORTABLE CHEST 1 VIEW COMPARISON:  06/08/2015 FINDINGS: A new right jugular PICC line is seen with tip overlying the superior cavoatrial junction. New linear opacity is seen in the left lung base, consistent with subsegmental atelectasis. No evidence of pulmonary consolidation or edema. Mild cardiomegaly and tortuosity  of thoracic aorta is stable. IMPRESSION: New right jugular PICC line tip overlies the superior cavoatrial junction. Mild left basilar atelectasis. Electronically Signed   By: Earle Gell M.D.   On:  06/12/2015 17:25    ASSESSMENT AND PLAN:   Active Problems:   Leg edema  1. severe bilateral lower extremity edema with superficial ulcers and elevated white count -Continue spironolactone -Seen by Dr. Vira Agar recommends Midodrin and albumin. Will defer it for Dr. Vira Agar to put those orders in -Blood cultures 2 -Empiric IV Zosyn -White count would have been elevated secondary to by mouth steroids which patient recently was taking for alcoholic hepatitis/cirrhosis. She completed the course -No fever -Monitor WBC count - get DVT studies.  2. Severe alcoholic liver cirrhosis -Followed by Dr. Vira Agar - was on tapering steroids.  3. 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 Hypothyroidism continue Synthroid  5. DVT prophylaxis none since patient has acquired coagulopathy   INR is 2.8   All the records are reviewed and case discussed with Care Management/Social Workerr. Management plans discussed with the patient, family and they are in agreement.  CODE STATUS: FUll.  TOTAL TIME TAKING CARE OF THIS PATIENT: 35 minutes.     POSSIBLE D/C IN 2-3 DAYS, DEPENDING ON CLINICAL CONDITION.   Vaughan Basta M.D on 06/14/2015   Between 7am to 6pm - Pager - [phone]  After 6pm go to [email or web address] - password [email or web address] [facility]  Office  [phone]  CC: Primary care physician; [place] Pink, MD  Note: This dictation was prepared with Dragon dictation along with smaller phrase technology. Any transcriptional errors that result from this process are unintentional.

## 2015-06-14 NOTE — Evaluation (Signed)
Physical Therapy Evaluation Patient Details Name: Anne Hickman MRN: [id number] DOB: [birth year] Today's Date: 06/14/2015   History of Present Illness  Pt admitted for renal failure and liver failure. Pt with complaints of weakness along with B LE swelling. Pt with history of cirrhosis of liver along with GERD and alcohol abuse. Pt with recent admission to hospital for similar symptoms.   Clinical Impression  Pt is a pleasant 65 year old female who was admitted for renal and liver failure. Pt performs bed mobility with mod I and transfers/ambulation with cga with rw. All mobility performed on room air with no SOB symptoms noted. Pt with short ambulation distance, however is safe with use of RW. Recommend use in home environment. Family feels safe and capable for pt to return home. Good support noted. Pt demonstrates deficits with strength/mobility/endurance. Would benefit from skilled PT to address above deficits and promote optimal return to PLOF.      Follow Up Recommendations Home health PT    Equipment Recommendations  Rolling walker with 5" wheels    Recommendations for Other Services       Precautions / Restrictions Precautions Precautions: None Restrictions Weight Bearing Restrictions: No      Mobility  Bed Mobility Overal bed mobility: Modified Independent             General bed mobility comments: safe technique with assist of bed rail. Cues to prop on B UE and push to scoot out towards EOB. Once seated at EOB, pt able to sit with independence  Transfers Overall transfer level: Needs assistance Equipment used: Rolling walker (2 wheeled) Transfers: Sit to/from Stand Sit to Stand: Min guard         General transfer comment: sit<>stand performed with rw and cga. Pt required cues for pushing from seated surface. Once standing, pt able to stand with supervision  Ambulation/Gait Ambulation/Gait assistance: Min guard Ambulation Distance (Feet): 5  Feet Assistive device: Rolling walker (2 wheeled) Gait Pattern/deviations: Step-to pattern     General Gait Details: ambulated with cues for sequencing and guidance of rw. Pt reports no pain during ambulation with weight through B UE on rw. Pt demonstrates safe techique, however fatigues quickly. Pt reports she felt she could ambulate further, however defers further distance at this time  Stairs            Wheelchair Mobility    Modified Rankin (Stroke Patients Only)       Balance Overall balance assessment: Needs assistance;History of Falls Sitting-balance support: Feet supported Sitting balance-Leahy Scale: Normal     Standing balance support: Bilateral upper extremity supported Standing balance-Leahy Scale: Good                               Pertinent Vitals/Pain Pain Assessment: No/denies pain    Home Living Family/patient expects to be discharged to:: Private residence Living Arrangements: Spouse/significant other Available Help at Discharge: Family Type of Home: House Home Access: Stairs to enter Entrance Stairs-Rails: None Entrance Stairs-Number of Steps: 2 Home Layout: Able to live on main level with bedroom/bathroom Home Equipment: None      Prior Function Level of Independence: Independent               Hand Dominance        Extremity/Trunk Assessment   Upper Extremity Assessment: Overall WFL for tasks assessed           Lower Extremity Assessment: Generalized  weakness (grossly 3/5 and painful)         Communication   Communication: No difficulties  Cognition Arousal/Alertness: Awake/alert Behavior During Therapy: WFL for tasks assessed/performed Overall Cognitive Status: Within Functional Limits for tasks assessed                      General Comments      Exercises        Assessment/Plan    PT Assessment Patient needs continued PT services  PT Diagnosis Generalized weakness;Difficulty walking    PT Problem List Decreased strength;Decreased balance;Decreased mobility  PT Treatment Interventions Gait training;Therapeutic exercise;Patient/family education   PT Goals (Current goals can be found in the Care Plan section) Acute Rehab PT Goals Patient Stated Goal: to go home PT Goal Formulation: With patient Time For Goal Achievement: 06/12/15 Potential to Achieve Goals: Good    Frequency Min 2X/week   Barriers to discharge        Co-evaluation               End of Session Equipment Utilized During Treatment: Gait belt Activity Tolerance: Patient tolerated treatment well Patient left: in chair;with family/visitor present Nurse Communication: Mobility status         Time: [id number] PT Time Calculation (min) (ACUTE ONLY): 31 min   Charges:   PT Evaluation $PT Eval Moderate Complexity: 1 Procedure     PT G Codes:        Anne Hickman 2015/06/25, 5:20 PM  Anne Hickman, PT, DPT [phone]

## 2015-06-14 NOTE — Consult Note (Signed)
Pt seen in room, children (adults) from [place] present.  Discussed why she is not a transplant candidate.  No new complaints from patient except legs hurt.  Her [place] of legs showed no DVT.  Chest clear, heart 2/6 SEM, abd not tender.  Labs noted, TB down to 16, renal function about the same. Cultures from blood were neg.  Would still give empiric antibiotics for probably 5 days unless Hospitalist object.  Will give another dose of albumin to help with renal perfusion.    If patient survives this admission she likely would need to go to skilled nursing or rehab or hospice home.

## 2015-06-15 DIAGNOSIS — K767 Hepatorenal syndrome: Principal | ICD-10-CM

## 2015-06-15 DIAGNOSIS — E039 Hypothyroidism, unspecified: Secondary | ICD-10-CM

## 2015-06-15 DIAGNOSIS — Z515 Encounter for palliative care: Secondary | ICD-10-CM

## 2015-06-15 DIAGNOSIS — F1010 Alcohol abuse, uncomplicated: Secondary | ICD-10-CM

## 2015-06-15 DIAGNOSIS — E785 Hyperlipidemia, unspecified: Secondary | ICD-10-CM

## 2015-06-15 DIAGNOSIS — D72829 Elevated white blood cell count, unspecified: Secondary | ICD-10-CM

## 2015-06-15 DIAGNOSIS — K7031 Alcoholic cirrhosis of liver with ascites: Secondary | ICD-10-CM

## 2015-06-15 DIAGNOSIS — R600 Localized edema: Secondary | ICD-10-CM

## 2015-06-15 DIAGNOSIS — D649 Anemia, unspecified: Secondary | ICD-10-CM

## 2015-06-15 LAB — COMPREHENSIVE METABOLIC PANEL
ALBUMIN: 2 g/dL — AB (ref 3.5–5.0)
ALK PHOS: 110 U/L (ref 38–126)
ALT: 93 U/L — AB (ref 14–54)
AST: 108 U/L — ABNORMAL HIGH (ref 15–41)
Anion gap: 8 (ref 5–15)
BILIRUBIN TOTAL: 14.4 mg/dL — AB (ref 0.3–1.2)
BUN: 98 mg/dL — ABNORMAL HIGH (ref 6–20)
CALCIUM: 7.5 mg/dL — AB (ref 8.9–10.3)
CO2: 17 mmol/L — AB (ref 22–32)
CREATININE: 2.25 mg/dL — AB (ref 0.44–1.00)
Chloride: 105 mmol/L (ref 101–111)
GFR calc non Af Amer: 22 mL/min — ABNORMAL LOW (ref >60)
GFR, EST AFRICAN AMERICAN: 26 mL/min — AB (ref >60)
GLUCOSE: 89 mg/dL (ref 65–99)
Potassium: 4 mmol/L (ref 3.5–5.1)
SODIUM: 130 mmol/L — AB (ref 135–145)
TOTAL PROTEIN: 6.4 g/dL — AB (ref 6.5–8.1)

## 2015-06-15 LAB — CBC WITH DIFFERENTIAL/PLATELET
BASOS PCT: 0 %
Basophils Absolute: 0 10*3/uL (ref 0–0.1)
EOS ABS: 0.3 10*3/uL (ref 0–0.7)
Eosinophils Relative: 1 %
HEMATOCRIT: 17.1 % — AB (ref 35.0–47.0)
HEMOGLOBIN: 5.7 g/dL — AB (ref 12.0–16.0)
LYMPHS ABS: 2 10*3/uL (ref 1.0–3.6)
LYMPHS PCT: 7 %
MCH: 34 pg (ref 26.0–34.0)
MCHC: 33.3 g/dL (ref 32.0–36.0)
MCV: 102 fL — ABNORMAL HIGH (ref 80.0–100.0)
MONOS PCT: 8 %
Monocytes Absolute: 2.3 10*3/uL — ABNORMAL HIGH (ref 0.2–0.9)
NEUTROS ABS: 24 10*3/uL — AB (ref 1.4–6.5)
Neutrophils Relative %: 84 %
Platelets: 132 10*3/uL — ABNORMAL LOW (ref 150–440)
RBC: 1.67 MIL/uL — ABNORMAL LOW (ref 3.80–5.20)
RDW: 22 % — AB (ref 11.5–14.5)
WBC: 28.6 10*3/uL — ABNORMAL HIGH (ref 3.6–11.0)

## 2015-06-15 LAB — PREPARE RBC (CROSSMATCH)

## 2015-06-15 LAB — ABO/RH: ABO/RH(D): A NEG

## 2015-06-15 LAB — HEMOGLOBIN: Hemoglobin: 5.8 g/dL — ABNORMAL LOW (ref 12.0–16.0)

## 2015-06-15 MED ORDER — FUROSEMIDE 40 MG PO TABS
40.0000 mg | ORAL_TABLET | Freq: Two times a day (BID) | ORAL | Status: DC
  Administered 2015-06-15 – 2015-06-16 (×2): 40 mg via ORAL
  Filled 2015-06-15 (×2): qty 1

## 2015-06-15 MED ORDER — LACTULOSE 10 GM/15ML PO SOLN
10.0000 g | Freq: Every day | ORAL | Status: DC
  Administered 2015-06-15: 10 g via ORAL
  Filled 2015-06-15: qty 30

## 2015-06-15 MED ORDER — DIPHENHYDRAMINE HCL 25 MG PO CAPS
25.0000 mg | ORAL_CAPSULE | Freq: Once | ORAL | Status: AC
  Administered 2015-06-15: 25 mg via ORAL
  Filled 2015-06-15: qty 1

## 2015-06-15 MED ORDER — SODIUM CHLORIDE 0.9 % IV SOLN
Freq: Once | INTRAVENOUS | Status: AC
  Administered 2015-06-15: 21:00:00 via INTRAVENOUS

## 2015-06-15 MED ORDER — FUROSEMIDE 10 MG/ML IJ SOLN
20.0000 mg | Freq: Once | INTRAMUSCULAR | Status: AC
  Administered 2015-06-15: 20 mg via INTRAVENOUS
  Filled 2015-06-15: qty 2

## 2015-06-15 MED ORDER — SODIUM CHLORIDE 0.9 % IV SOLN: Freq: Once | INTRAVENOUS | Status: DC

## 2015-06-15 NOTE — Progress Notes (Signed)
ANTIBIOTIC CONSULT NOTE - follow up  Pharmacy Consult for Zosyn Day 4 Indication: LE ulcers  Allergies  Allergen Reactions  . Bee Venom Anaphylaxis  . Advil [Ibuprofen] Nausea And Vomiting  . Aleve [Naproxen Sodium] Nausea And Vomiting  . Betadine [Povidone Iodine] Other (See Comments)    Reaction:  Burning   . Codeine Other (See Comments)    Reaction:  GI upset     Patient Measurements: Height: 5\' 4"  (162.6 cm) Weight: 130 lb 6.4 oz (59.149 kg) IBW/kg (Calculated) : 54.7  Vital Signs: Temp: 97.8 F (36.6 C) (01/27 1304) Temp Source: Oral (01/27 1304) BP: 117/49 mmHg (01/27 1304) Pulse Rate: 83 (01/27 1304) Intake/Output from previous day: 01/26 0701 - 01/27 0700 In: 2999.3 [P.O.:240; I.V.:2578.3; IV Piggyback:181] Out: 900 [Urine:900] Intake/Output from this shift: Total I/O In: 485 [P.O.:240; I.V.:195; IV Piggyback:50] Out: 450 [Urine:450]  Labs:  Recent Labs  06/13/15 0547 06/14/15 0610 06/15/15 0528 06/15/15 1209  WBC  --   --   --  28.6*  HGB  --   --   --  5.7*  PLT  --   --   --  132*  CREATININE 2.42* 2.14* 2.25*  --    Estimated Creatinine Clearance: 22.4 mL/min (by C-G formula based on Cr of 2.25). No results for input(s): VANCOTROUGH, VANCOPEAK, VANCORANDOM, GENTTROUGH, GENTPEAK, GENTRANDOM, TOBRATROUGH, TOBRAPEAK, TOBRARND, AMIKACINPEAK, AMIKACINTROU, AMIKACIN in the last 72 hours.   Microbiology: Recent Results (from the past 720 hour(s))  Culture, blood (routine x 2)     Status: None   Collection Time: 06/08/15  4:16 PM  Result Value Ref Range Status   Specimen Description BLOOD LEFT ARM  Final   Special Requests   Final    BOTTLES DRAWN AEROBIC AND ANAEROBIC 2CCAEROB,3CCANA   Culture NO GROWTH 5 DAYS  Final   Report Status 06/13/2015 FINAL  Final  Culture, blood (routine x 2)     Status: None   Collection Time: 06/08/15  4:16 PM  Result Value Ref Range Status   Specimen Description BLOOD LEFT ASSIST CONTROL  Final   Special Requests    Final    BOTTLES DRAWN AEROBIC AND ANAEROBIC 5CCAEROBIC,2CCANA   Culture NO GROWTH 5 DAYS  Final   Report Status 06/13/2015 FINAL  Final  Culture, blood (Routine X 2) w Reflex to ID Panel     Status: None (Preliminary result)   Collection Time: 06/12/15  6:56 PM  Result Value Ref Range Status   Specimen Description BLOOD RIGHT HAND  Final   Special Requests BOTTLES DRAWN AEROBIC AND ANAEROBIC 1CC  Final   Culture NO GROWTH 3 DAYS  Final   Report Status PENDING  Incomplete  Culture, blood (Routine X 2) w Reflex to ID Panel     Status: None (Preliminary result)   Collection Time: 06/12/15  6:58 PM  Result Value Ref Range Status   Specimen Description BLOOD LEFT HAND  Final   Special Requests BOTTLES DRAWN AEROBIC AND ANAEROBIC 1CC  Final   Culture NO GROWTH 3 DAYS  Final   Report Status PENDING  Incomplete    Medical History: Past Medical History  Diagnosis Date  . Insomnia   . Generalized anxiety disorder   . Other and unspecified hyperlipidemia   . Unspecified hypothyroidism   . Alcohol abuse, episodic   . Eczema herpeticum     Medications:  Scheduled:  . sodium chloride   Intravenous Once  . diphenhydrAMINE  25 mg Oral Once  . furosemide  20 mg Intravenous Once  . furosemide  40 mg Oral BID  . lactulose  10 g Oral Daily  . levothyroxine  75 mcg Oral QAC breakfast  . midodrine  10 mg Oral TID WC  . multivitamin with minerals  1 tablet Oral Daily  . pantoprazole  40 mg Oral Daily  . piperacillin-tazobactam (ZOSYN)  IV  3.375 g Intravenous Q8H  . spironolactone  50 mg Oral Daily   Infusions:    Assessment: 65 y/o F admitted with LE edema and ulcers ordered empiric abx.    Plan:  1/27 Scr 2.25  crcl 22.4.  Will continue current regimen of EI Zosyn 3.375gm IV q8h. Follow renal fxn.  Zeina Akkerman Deanda A 06/15/2015,3:54 PM

## 2015-06-15 NOTE — Consult Note (Signed)
Pt discussed with Dr. Darvin Neighbours, her hgb was signif lower today from 8 to 5, how much is real or lab error?  Recommend repeat and if low give a unit of blood today and tomorrow.  Eyes still yellow TB down to 14.  Abd distended more which is to be expected given the iv fluid she has gotten while trying to prevent hepatorenal syndrome.  If she gets a unit of blood may not need albumin also today.  If does not get blood would give another 12.5g of albumin.  Swelling in feet has gone down significantly.  Will likely do better with 2-3 weeks rehab after discharge.  Dr. Rayann Heman on call but I  will not have him see her due to he has a URI unless hospitalist needs his assistance.

## 2015-06-15 NOTE — Progress Notes (Signed)
[facility] NAME: Anne Hickman    MR#:  [id number]  DATE OF BIRTH:  August 21, 1950  SUBJECTIVE:  CHIEF COMPLAINT:  No chief complaint on file.    Sent by Dr. Tiffany Kocher from clinic due to swelling on legs and redness.   She has liver cirrhosis and was recently admitted and discharged with prednisone.    LFT remains high, pt is alert and oriented.   Her husband  present in room.  REVIEW OF SYSTEMS:  CONSTITUTIONAL: No fever, fatigue or weakness.  EYES: No blurred or double vision.  EARS, NOSE, AND THROAT: No tinnitus or ear pain.  RESPIRATORY: No cough, shortness of breath, wheezing or hemoptysis.  CARDIOVASCULAR: No chest pain, orthopnea, edema.  GASTROINTESTINAL: No nausea, vomiting, diarrhea or abdominal pain.  GENITOURINARY: No dysuria, hematuria.  ENDOCRINE: No polyuria, nocturia,  HEMATOLOGY: No anemia, easy bruising or bleeding SKIN: No rash or lesion. LLE ulcer and redness MUSCULOSKELETAL: No joint pain or arthritis.   NEUROLOGIC: No tingling, numbness, weakness.  PSYCHIATRY: No anxiety or depression.   ROS  DRUG ALLERGIES:   Allergies  Allergen Reactions  . Bee Venom Anaphylaxis  . Advil [Ibuprofen] Nausea And Vomiting  . Aleve [Naproxen Sodium] Nausea And Vomiting  . Betadine [Povidone Iodine] Other (See Comments)    Reaction:  Burning   . Codeine Other (See Comments)    Reaction:  GI upset     VITALS:  Blood pressure 121/58, pulse 80, temperature 97.3 F (36.3 C), temperature source Oral, resp. rate 18, height 5\' 4"  (D34-534 m), weight 59.149 kg (130 lb 6.4 oz), SpO2 100 %.  PHYSICAL EXAMINATION:  GENERAL:  65 y.o.-year-old patient lying in the bed with no acute distress.  EYES: Pupils equal, round, reactive to light and accommodation. No scleral icterus. Extraocular muscles intact.  HEENT: Head atraumatic, normocephalic. Oropharynx and nasopharynx clear.  NECK:  Supple, no jugular venous distention. No  thyroid enlargement, no tenderness.  LUNGS: Normal breath sounds bilaterally, no wheezing, rales,rhonchi or crepitation. No use of accessory muscles of respiration.  CARDIOVASCULAR: S1, S2 normal. No murmurs, rubs, or gallops.  ABDOMEN: Soft, mild tender, some distended. Bowel sounds present. No organomegaly or mass.  EXTREMITIES: B/L LE edema NEUROLOGIC: Cranial nerves II through XII are intact. Muscle strength 4/5 in all extremities. Sensation intact. Gait not checked.  PSYCHIATRIC: The patient is alert and oriented x 3.  SKIN: LLE ulcer 3x 2 cm. Serous discharge. Surrounding redness, warm, tender.  Physical Exam LABORATORY PANEL:   CBC  Recent Labs Lab 06/08/15 1533  WBC 25.3*  HGB 8.2*  HCT 24.0*  PLT 260   ------------------------------------------------------------------------------------------------------------------  Chemistries   Recent Labs Lab 06/15/15 0528  NA 130*  K 4.0  CL 105  CO2 17*  GLUCOSE 89  BUN 98*  CREATININE 2.25*  CALCIUM 7.5*  AST 108*  ALT 93*  ALKPHOS 110  BILITOT 14.4*   ------------------------------------------------------------------------------------------------------------------  Cardiac Enzymes  Recent Labs Lab 06/08/15 1616  TROPONINI 0.04*   ------------------------------------------------------------------------------------------------------------------  RADIOLOGY:  US Venous Img Lower Bilateral  06/14/2015  CLINICAL DATA:  Bilateral lower extremity pain and edema. Evaluate for DVT. EXAM: BILATERAL LOWER EXTREMITY VENOUS DOPPLER ULTRASOUND TECHNIQUE: Gray-scale sonography with graded compression, as well as color Doppler and duplex ultrasound were performed to evaluate the lower extremity deep venous systems from the level of the common femoral vein and including the common femoral, femoral, profunda femoral, popliteal and calf veins including the posterior tibial, peroneal  and gastrocnemius veins when visible. The  superficial great saphenous vein was also interrogated. Spectral Doppler was utilized to evaluate flow at rest and with distal augmentation maneuvers in the common femoral, femoral and popliteal veins. COMPARISON:  None. FINDINGS: RIGHT LOWER EXTREMITY Common Femoral Vein: No evidence of thrombus. Normal compressibility, respiratory phasicity and response to augmentation. Saphenofemoral Junction: No evidence of thrombus. Normal compressibility and flow on color Doppler imaging. Profunda Femoral Vein: No evidence of thrombus. Normal compressibility and flow on color Doppler imaging. Femoral Vein: No evidence of thrombus. Normal compressibility, respiratory phasicity and response to augmentation. Popliteal Vein: No evidence of thrombus. Normal compressibility, respiratory phasicity and response to augmentation. Calf Veins: No evidence of thrombus. Normal compressibility and flow on color Doppler imaging. Superficial Great Saphenous Vein: No evidence of thrombus. Normal compressibility and flow on color Doppler imaging. Venous Reflux:  None. Other Findings: A minimal amount of subcutaneous edema is noted within the right lower extremity. LEFT LOWER EXTREMITY Common Femoral Vein: No evidence of thrombus. Normal compressibility, respiratory phasicity and response to augmentation. Saphenofemoral Junction: No evidence of thrombus. Normal compressibility and flow on color Doppler imaging. Profunda Femoral Vein: No evidence of thrombus. Normal compressibility and flow on color Doppler imaging. Femoral Vein: No evidence of thrombus. Normal compressibility, respiratory phasicity and response to augmentation. Popliteal Vein: No evidence of thrombus. Normal compressibility, respiratory phasicity and response to augmentation. Calf Veins: No evidence of thrombus. Normal compressibility and flow on color Doppler imaging. Superficial Great Saphenous Vein: No evidence of thrombus. Normal compressibility and flow on color Doppler  imaging. Venous Reflux:  None. Other Findings: A minimal to moderate amount of subcutaneous edema is noted within the left lower extremity. IMPRESSION: No evidence of DVT within either lower extremity. Electronically Signed   By: Sandi Mariscal M.D.   On: 06/14/2015 11:42    ASSESSMENT AND PLAN:   Active Problems:   Leg edema  1. severe bilateral lower extremity edema with superficial ulcers and elevated white count - Blood cultures 2 - On IV Zosyn -White count would have been elevated secondary to by prednisone. - No fever - Monitor WBC count - Negative DVT studies.  2. 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 Vira Agar - was on tapering steroids. - prognosis extremely poor Add lasix IV BID. On Aldactone. Albumin given.   Called palliative care consult to help d/c plan.  3. 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 Hypothyroidism continue Synthroid  5. DVT prophylaxis none since patient has acquired coagulopathy   INR is 2.8  6. ARF - POA Due to cirrhosis. Monitor.  All the records are reviewed and case discussed with Care Management/Social Workerr. Management plans discussed with the patient, family and they are in agreement.  CODE STATUS: FUll.  TOTAL TIME TAKING CARE OF THIS PATIENT: 35 minutes.    POSSIBLE D/C IN 2-3 DAYS, DEPENDING ON CLINICAL CONDITION.  Likely Discharge home with PT although husband is interested in SNF.    Hillary Bow R M.D on 06/15/2015   Between 7am to 6pm - Pager - [phone]  After 6pm go to [email or web address] - password [email or web address] [facility]  Office  [phone]  CC: Primary care physician; [place] Pink, MD  Note: This dictation was prepared with Dragon dictation along with smaller phrase technology. Any transcriptional errors that result from this process are unintentional.

## 2015-06-15 NOTE — Consult Note (Signed)
Palliative Medicine Inpatient Consult Note   Name: Anne Hickman Date: 06/15/2015 MRN: [id number]  DOB: [birth year]  Referring Physician: Hillary Bow, MD   _______________________________________________________________  Palliative Care consult requested for this 65 y.o. female for goals of medical therapy in patient with advanced alcoholic cirrhosis and hepatorenal syndrome.  Pt had just been discharged from here on 05/31/15.  She was brought back in from Dr [employer] office due to her presentation there with significant leg edema since her discharge.  Midodrin and albumin have been given .  Dr Vira Agar had recommended either skilled nursing or hospice home.    PLAN: I was able to meet with the pts three adult children who are here from [place].  Two of them are going back to [place] and the one daughter is going to stay here longer.  This was a very helpful meeting and yet I will have to have another meeting on Monday with pt and husband.  Pt was too confused tonight to participate in decisions. I let pt's husband know we will likely be talking about HOSPICE  IN THE PTS HOME on Monday. This seems most appropriate given her MELD score which predicts a 53 % three month mortality risk.  She could die anyday but she could also live several months. She certainly meets Hospice Criteria --but she does not meet Hospice Home criteria.   The adult children tell me that the pts husband is still in a great deal of denial and he is talking about taking vacations 'next year' etc etc.  They feel he will have a hard time dealing with code status.  I did prepare him for further discussion and told him what we will be talking about and he said this would be ok to talk about Monday.  Pt will likely be getting reasonable things treated while also getting hospice in the home care.  Adult children stated that the family would be able to pull together funds so that pt could also have some hired aides to be with her during  the day when husband cannot be there. And the adult children may take turns being here when they can also.  Additionally, pts husband may be able to take some time off of his job. So they think they will have 24/7 caregivers patched together between family and hired help. The adult children think Hospice in the Home would be best.    Plan another meeting on Monday and perhaps we can finalize Hospice in the Home plans at that time.  And also hope to address code status then with pt/husband.    Adult children feel husband listens to Dr Vira Agar so they might want him to say something about DNR --to get him thinking about it (if possible).       IMPRESSION: 1.  Alcoholic Cirrhosis ----pertinent labs:  Total bili 16.3 -14.4 now.   Cr 2.49 - 2.25 now  INR 3.56 on 1/12 and 2.80 now.  NH4 52 on 1/24.    Positive for presence of ascites described as moderate on CT scan from 1/20.  Sodium 130.  No dialysis has been done as far as I can ascertain.  Albumin 2.0.   This gives her a MELD score of 53% 3 month mortality.   2.  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  Leg ulcers--superficial but thought to be infected and being treated with Zosyn 4.  Alcoholism 5.  Hypothyroidism 6.  Dyslipidemia 7.  Anxiety  8.  Acquired coagulopathy due to cirrhosis 9.  Increased leg edema and tremors related to liver disease ' 10.  Eczema 11.  Leukocytosis 12.  Hepatic encephalopathy 13.  Anemia --severe drop if true lab value (now showing hgb of 5.8 where it had been 8.2 on 1/20/ 17.   14.  Probable depression  --------------------------------------------------------------------------------------------------------------------    REVIEW OF SYSTEMS:  Patient is not able to provide ROS in detail due to illness  SPIRITUAL SUPPORT SYSTEM: Yes.  SOCIAL HISTORY:  reports that she has never smoked. She has never used smokeless tobacco. She reports that she does not drink alcohol  or use illicit drugs.  Has [employer] per facesheet.   LEGAL DOCUMENTS:  none  CODE STATUS: Full code  PAST MEDICAL HISTORY: Past Medical History  Diagnosis Date  . Insomnia   . Generalized anxiety disorder   . Other and unspecified hyperlipidemia   . Unspecified hypothyroidism   . Alcohol abuse, episodic   . Eczema herpeticum     PAST SURGICAL HISTORY:  Past Surgical History  Procedure Laterality Date  . Appendectomy  1978  . Abdominal hysterectomy      Partial   . Esophagogastroduodenoscopy (egd) with propofol N/A 12/13/2014    Procedure: ESOPHAGOGASTRODUODENOSCOPY (EGD) WITH PROPOFOL;  Surgeon: Manya Silvas, MD;  Location: [facility];  Service: Endoscopy;  Laterality: N/A;    ALLERGIES:  is allergic to bee venom; advil; aleve; betadine; and codeine.  MEDICATIONS:  Current Facility-Administered Medications  Medication Dose Route Frequency Provider Last Rate Last Dose  . 0.9 %  sodium chloride infusion   Intravenous Once Srikar Sudini, MD      . 0.9 %  sodium chloride infusion   Intravenous Once Lytle Butte, MD      . furosemide (LASIX) tablet 40 mg  40 mg Oral BID Hillary Bow, MD   40 mg at 06/15/15 1637  . lactulose (CHRONULAC) 10 GM/15ML solution 10 g  10 g Oral Daily Hillary Bow, MD   10 g at 06/15/15 1637  . levothyroxine (SYNTHROID, LEVOTHROID) tablet 75 mcg  75 mcg Oral QAC breakfast Fritzi Mandes, MD   75 mcg at 06/15/15 [phone]  . midodrine (PROAMATINE) tablet 10 mg  10 mg Oral TID WC Manya Silvas, MD   10 mg at 06/15/15 1636  . multivitamin with minerals tablet 1 tablet  1 tablet Oral Daily Fritzi Mandes, MD   1 tablet at 06/15/15 [phone]  . ondansetron (ZOFRAN) tablet 4 mg  4 mg Oral Q6H PRN Fritzi Mandes, MD       Or  . ondansetron (ZOFRAN) injection 4 mg  4 mg Intravenous Q6H PRN Fritzi Mandes, MD      . oxyCODONE (Oxy IR/ROXICODONE) immediate release tablet 5 mg  5 mg Oral Q4H PRN Vaughan Basta, MD      . pantoprazole (PROTONIX) EC tablet 40 mg   40 mg Oral Daily Fritzi Mandes, MD   40 mg at 06/15/15 [id number]  . piperacillin-tazobactam (ZOSYN) IVPB 3.375 g  3.375 g Intravenous Q8H Fritzi Mandes, MD   3.375 g at 06/15/15 1223  . spironolactone (ALDACTONE) tablet 50 mg  50 mg Oral Daily Fritzi Mandes, MD   50 mg at 06/15/15 [id number]    Vital Signs: BP 117/54 mmHg  Pulse 70  Temp(Src) 97.5 F (36.4 C) (Oral)  Resp 20  Ht 5\' 4"  (1.626 m)  Wt 59.149 kg (130 lb 6.4 oz)  BMI 22.37 kg/m2  SpO2 100% [employer]  06/12/15 1342  Weight: 59.149 kg (130 lb 6.4 oz)    Estimated body mass index is 22.37 kg/(m^2) as calculated from the following:   Height as of this encounter: 5\' 4"  (1.626 m).   Weight as of this encounter: 59.149 kg (130 lb 6.4 oz).  PERFORMANCE STATUS (ECOG) : 3 - Symptomatic, >50% confined to bed  PHYSICAL EXAM: Lethargic Opens eyes but does not talk at this late hour Husband is bedside EOMI Hrt rrr no m Lungs cta Abd obese and protuberant Ext no mottling or cyanosis    LABS: CBC:    Component Value Date/Time   WBC 28.6* 06/15/2015 1209   HGB 5.8* 06/15/2015 1713   HCT 17.1* 06/15/2015 1209   PLT 132* 06/15/2015 1209   MCV 102.0* 06/15/2015 1209   NEUTROABS 24.0* 06/15/2015 1209   LYMPHSABS 2.0 06/15/2015 1209   MONOABS 2.3* 06/15/2015 1209   EOSABS 0.3 06/15/2015 1209   BASOSABS 0.0 06/15/2015 1209   Comprehensive Metabolic Panel:    Component Value Date/Time   NA 130* 06/15/2015 0528   K 4.0 06/15/2015 0528   CL 105 06/15/2015 0528   CO2 17* 06/15/2015 0528   BUN 98* 06/15/2015 0528   CREATININE 2.25* 06/15/2015 0528   GLUCOSE 89 06/15/2015 0528   CALCIUM 7.5* 06/15/2015 0528   AST 108* 06/15/2015 0528   ALT 93* 06/15/2015 0528   ALKPHOS 110 06/15/2015 0528   BILITOT 14.4* 06/15/2015 0528   PROT 6.4* 06/15/2015 0528   ALBUMIN 2.0* 06/15/2015 0528   TESTS:  CT abd pelvis and chest 06/08/15: Cirrhotic appearance of the liver with moderate amount of ascites. Layering cholelithiasis. No secondary  signs of cholecystitis on this nonenhanced exam. Tiny nonobstructing left renal calculi. 3 cm partially calcified right inferior pole thyroid mass. Osteoarthritic changes of the lumbosacral spine with mild anterolisthesis of L3 on L4.    More than 50% of the visit was spent in counseling/coordination of care: Yes  Time Spent:  80 minutes

## 2015-06-16 LAB — CBC
HCT: 20.2 % — ABNORMAL LOW (ref 35.0–47.0)
Hemoglobin: 6.9 g/dL — ABNORMAL LOW (ref 12.0–16.0)
MCH: 33.3 pg (ref 26.0–34.0)
MCHC: 34.3 g/dL (ref 32.0–36.0)
MCV: 97.3 fL (ref 80.0–100.0)
PLATELETS: 139 10*3/uL — AB (ref 150–440)
RBC: 2.08 MIL/uL — ABNORMAL LOW (ref 3.80–5.20)
RDW: 23.6 % — AB (ref 11.5–14.5)
WBC: 26.5 10*3/uL — ABNORMAL HIGH (ref 3.6–11.0)

## 2015-06-16 LAB — COMPREHENSIVE METABOLIC PANEL
ALK PHOS: 134 U/L — AB (ref 38–126)
ALT: 94 U/L — AB (ref 14–54)
AST: 109 U/L — AB (ref 15–41)
Albumin: 2 g/dL — ABNORMAL LOW (ref 3.5–5.0)
Anion gap: 9 (ref 5–15)
BILIRUBIN TOTAL: 15.7 mg/dL — AB (ref 0.3–1.2)
BUN: 94 mg/dL — AB (ref 6–20)
CO2: 17 mmol/L — ABNORMAL LOW (ref 22–32)
CREATININE: 2.26 mg/dL — AB (ref 0.44–1.00)
Calcium: 8.1 mg/dL — ABNORMAL LOW (ref 8.9–10.3)
Chloride: 108 mmol/L (ref 101–111)
GFR calc Af Amer: 26 mL/min — ABNORMAL LOW (ref >60)
GFR, EST NON AFRICAN AMERICAN: 22 mL/min — AB (ref >60)
Glucose, Bld: 88 mg/dL (ref 65–99)
Potassium: 4.4 mmol/L (ref 3.5–5.1)
Sodium: 134 mmol/L — ABNORMAL LOW (ref 135–145)
TOTAL PROTEIN: 6.7 g/dL (ref 6.5–8.1)

## 2015-06-16 LAB — OSMOLALITY, URINE: OSMOLALITY UR: 342 mosm/kg (ref 300–900)

## 2015-06-16 LAB — MAGNESIUM: MAGNESIUM: 1.6 mg/dL — AB (ref 1.7–2.4)

## 2015-06-16 LAB — HEMOGLOBIN AND HEMATOCRIT, BLOOD
HCT: 19.4 % — ABNORMAL LOW (ref 35.0–47.0)
HCT: 23.1 % — ABNORMAL LOW (ref 35.0–47.0)
HEMOGLOBIN: 6.5 g/dL — AB (ref 12.0–16.0)
Hemoglobin: 7.8 g/dL — ABNORMAL LOW (ref 12.0–16.0)

## 2015-06-16 LAB — PREPARE RBC (CROSSMATCH)

## 2015-06-16 LAB — PROTIME-INR
INR: 2.3
Prothrombin Time: 25.1 seconds — ABNORMAL HIGH (ref 11.4–15.0)

## 2015-06-16 MED ORDER — LACTULOSE 10 GM/15ML PO SOLN
30.0000 g | Freq: Two times a day (BID) | ORAL | Status: DC
  Administered 2015-06-16: 30 g via ORAL
  Filled 2015-06-16: qty 60

## 2015-06-16 MED ORDER — SODIUM CHLORIDE 0.9 % IV SOLN
Freq: Once | INTRAVENOUS | Status: AC
  Administered 2015-06-16: 12:00:00 via INTRAVENOUS

## 2015-06-16 MED ORDER — FUROSEMIDE 40 MG PO TABS
80.0000 mg | ORAL_TABLET | Freq: Two times a day (BID) | ORAL | Status: DC
  Administered 2015-06-16 – 2015-06-19 (×6): 80 mg via ORAL
  Filled 2015-06-16 (×6): qty 2

## 2015-06-16 MED ORDER — LACTULOSE 10 GM/15ML PO SOLN
20.0000 g | Freq: Two times a day (BID) | ORAL | Status: DC
  Administered 2015-06-16: 20 g via ORAL
  Filled 2015-06-16: qty 30

## 2015-06-16 MED ORDER — OCTREOTIDE ACETATE 100 MCG/ML IJ SOLN
100.0000 ug | Freq: Three times a day (TID) | INTRAMUSCULAR | Status: DC
  Administered 2015-06-16 – 2015-06-18 (×8): 100 ug via SUBCUTANEOUS
  Filled 2015-06-16 (×10): qty 1

## 2015-06-16 NOTE — Consult Note (Signed)
[facility]  CONSULT NOTE    Date: 06/16/2015                  Patient Name:  Anne Hickman  MRN: [id number]  DOB: 10-14-1950  Age / Sex: 65 y.o., female         PCP: Anne Pink, MD                 Service Requesting Consult: Dr. Darvin Hickman                 Reason for Consult: Acute renal failure            History of Present Illness: [name] is a 65 y.o. white female with end stage liver disease, alcoholic cirrhosis, hypothyroidism, alcoholism, who was admitted to [facility] on 06/12/2015 for Acute renal failure  Patient admitted with 2.49 from baseline of less than 1. Patient was then thought to be prerenal and started on NS at 100. Patient started to have increasing peripheral edema and ascites. She was given albumin. Currently on furosemide and spironolactone with some improvement in edema.   Patient started on midodrine for hypotension.    Medications: Outpatient medications: Prescriptions prior to admission  Medication Sig Dispense Refill Last Dose  . levothyroxine (SYNTHROID, LEVOTHROID) 75 MCG tablet Take 75 mcg by mouth daily before breakfast.   06/08/2015 at Unknown time  . Multiple Vitamin (MULTIVITAMIN WITH MINERALS) TABS tablet Take 1 tablet by mouth daily. 30 tablet 0 06/07/2015 at Unknown time  . ondansetron (ZOFRAN) 4 MG tablet Take 1 tablet (4 mg total) by mouth every 8 (eight) hours as needed for nausea or vomiting. (Patient not taking: Reported on 05/30/2015) 20 tablet 0   . pantoprazole (PROTONIX) 40 MG tablet Take 40 mg by mouth daily.   06/08/2015 at Unknown time  . predniSONE (DELTASONE) 20 MG tablet Take 2 tablets (40 mg total) by mouth daily with breakfast. 20 tablet 0 06/08/2015 at Unknown time  . spironolactone (ALDACTONE) 50 MG tablet Take 50 mg by mouth daily.   06/08/2015 at Unknown time  . traMADol (ULTRAM) 50 MG tablet Take 1 tablet (50 mg total) by mouth every 6 (six) hours as needed. (Patient not taking: Reported on  05/30/2015) 12 tablet 0     Current medications: Current Facility-Administered Medications  Medication Dose Route Frequency Provider Last Rate Last Dose  . 0.9 %  sodium chloride infusion   Intravenous Once Srikar Sudini, MD      . 0.9 %  sodium chloride infusion   Intravenous Once Srikar Sudini, MD      . furosemide (LASIX) tablet 40 mg  40 mg Oral BID Hillary Bow, MD   40 mg at 06/16/15 1001  . lactulose (CHRONULAC) 10 GM/15ML solution 20 g  20 g Oral BID Hillary Bow, MD   20 g at 06/16/15 1000  . levothyroxine (SYNTHROID, LEVOTHROID) tablet 75 mcg  75 mcg Oral QAC breakfast Fritzi Mandes, MD   75 mcg at 06/16/15 1000  . midodrine (PROAMATINE) tablet 10 mg  10 mg Oral TID WC Manya Silvas, MD   10 mg at 06/16/15 1001  . multivitamin with minerals tablet 1 tablet  1 tablet Oral Daily Fritzi Mandes, MD   1 tablet at 06/16/15 1000  . ondansetron (ZOFRAN) tablet 4 mg  4 mg Oral Q6H PRN Fritzi Mandes, MD       Or  . ondansetron (ZOFRAN) injection 4 mg  4 mg  Intravenous Q6H PRN Fritzi Mandes, MD      . oxyCODONE (Oxy IR/ROXICODONE) immediate release tablet 5 mg  5 mg Oral Q4H PRN Vaughan Basta, MD      . pantoprazole (PROTONIX) EC tablet 40 mg  40 mg Oral Daily Fritzi Mandes, MD   40 mg at 06/16/15 1001  . piperacillin-tazobactam (ZOSYN) IVPB 3.375 g  3.375 g Intravenous Q8H Fritzi Mandes, MD   3.375 g at 06/16/15 0547  . spironolactone (ALDACTONE) tablet 50 mg  50 mg Oral Daily Fritzi Mandes, MD   50 mg at 06/16/15 1001      Allergies: Allergies  Allergen Reactions  . Bee Venom Anaphylaxis  . Advil [Ibuprofen] Nausea And Vomiting  . Aleve [Naproxen Sodium] Nausea And Vomiting  . Betadine [Povidone Iodine] Other (See Comments)    Reaction:  Burning   . Codeine Other (See Comments)    Reaction:  GI upset       Past Medical History: Past Medical History  Diagnosis Date  . Insomnia   . Generalized anxiety disorder   . Other and unspecified hyperlipidemia   . Unspecified hypothyroidism    . Alcohol abuse, episodic   . Eczema herpeticum      Past Surgical History: Past Surgical History  Procedure Laterality Date  . Appendectomy  1978  . Abdominal hysterectomy      Partial   . Esophagogastroduodenoscopy (egd) with propofol N/A 12/13/2014    Procedure: ESOPHAGOGASTRODUODENOSCOPY (EGD) WITH PROPOFOL;  Surgeon: Manya Silvas, MD;  Location: [place];  Service: Endoscopy;  Laterality: N/A;     Family History: Family History  Problem Relation Age of Onset  . Coronary artery disease Father   . Heart disease Father   . Breast cancer Maternal Aunt 60  . Mental illness Mother      Social History: Social History   Social History  . Marital Status: Married    Spouse Name: N/A  . Number of Children: 4  . Years of Education: N/A   Occupational History  . Full time    Social History Main Topics  . Smoking status: Never Smoker   . Smokeless tobacco: Never Used  . Alcohol Use: No     Comment: 1- 2 bottles of wine a day  . Drug Use: No  . Sexual Activity: Not on file   Other Topics Concern  . Not on file   Social History Narrative   Lives with spouse.     Review of Systems: Review of Systems  Constitutional: Positive for weight loss and malaise/fatigue. Negative for fever, chills and diaphoresis.  HENT: Negative for congestion, ear discharge, ear pain, hearing loss, nosebleeds, sore throat and tinnitus.   Eyes: Negative for blurred vision, double vision, photophobia, pain, discharge and redness.  Respiratory: Negative for cough, hemoptysis, sputum production, shortness of breath, wheezing and stridor.   Cardiovascular: Positive for orthopnea and leg swelling. Negative for chest pain, palpitations, claudication and PND.  Gastrointestinal: Negative for heartburn, nausea, vomiting, abdominal pain, diarrhea, constipation, blood in stool and melena.  Genitourinary: Negative for dysuria, urgency, frequency, hematuria and flank pain.  Musculoskeletal:  Negative for myalgias, back pain, joint pain, falls and neck pain.  Skin: Positive for itching. Negative for rash.  Neurological: Positive for weakness. Negative for dizziness, tingling, tremors, sensory change, speech change, focal weakness, seizures, loss of consciousness and headaches.  Endo/Heme/Allergies: Negative for environmental allergies and polydipsia. Bruises/bleeds easily.  Psychiatric/Behavioral: Positive for depression. Negative for suicidal ideas, hallucinations, memory loss and  substance abuse. The patient is nervous/anxious and has insomnia.     Vital Signs: Blood pressure 117/60, pulse 73, temperature 97.7 F (36.5 C), temperature source Oral, resp. rate 16, height 5\' 4"  (1.626 m), weight 59.149 kg (130 lb 6.4 oz), SpO2 100 %.  Weight trends: Filed Weights   06/12/15 1342  Weight: 59.149 kg (130 lb 6.4 oz)    Physical Exam: General: NAD, frail  Head: Normocephalic, atraumatic. Moist oral mucosal membranes  Eyes: +icterus  Neck: Supple, trachea midline  Lungs:  Clear to auscultation  Heart: Regular rate and rhythm  Abdomen:  Soft, nontender, +ascites  Extremities: 1+ peripheral edema.  Neurologic: Nonfocal, moving all four extremities  Skin: jaundice        Lab results: Basic Metabolic Panel:  Recent Labs Lab 06/13/15 0547 06/14/15 0610 06/15/15 0528 06/16/15 0515  NA 130*  --  130* 134*  K 4.0  --  4.0 4.4  CL 101  --  105 108  CO2 19*  --  17* 17*  GLUCOSE 101*  --  89 88  BUN 108*  --  98* 94*  CREATININE 2.42* 2.14* 2.25* 2.26*  CALCIUM 8.1*  --  7.5* 8.1*  MG  --   --   --  1.6*    Liver Function Tests:  Recent Labs Lab 06/13/15 0547 06/15/15 0528 06/16/15 0515  AST 112* 108* 109*  ALT 97* 93* 94*  ALKPHOS 123 110 134*  BILITOT 16.3* 14.4* 15.7*  PROT 6.8 6.4* 6.7  ALBUMIN 1.9* 2.0* 2.0*   No results for input(s): LIPASE, AMYLASE in the last 168 hours.  Recent Labs Lab 06/12/15 1511  AMMONIA 52*    CBC:  Recent  Labs Lab 06/15/15 1209 06/15/15 1713 06/16/15 0103 06/16/15 0515  WBC 28.6*  --   --  26.5*  NEUTROABS 24.0*  --   --   --   HGB 5.7* 5.8* 6.5* 6.9*  HCT 17.1*  --  19.4* 20.2*  MCV 102.0*  --   --  97.3  PLT 132*  --   --  139*    Cardiac Enzymes: No results for input(s): CKTOTAL, CKMB, CKMBINDEX, TROPONINI in the last 168 hours.  BNP: Invalid input(s): POCBNP  CBG: No results for input(s): GLUCAP in the last 168 hours.  Microbiology: Results for orders placed or performed during the hospital encounter of 06/12/15  Culture, blood (Routine X 2) w Reflex to ID Panel     Status: None (Preliminary result)   Collection Time: 06/12/15  6:56 PM  Result Value Ref Range Status   Specimen Description BLOOD RIGHT HAND  Final   Special Requests BOTTLES DRAWN AEROBIC AND ANAEROBIC 1CC  Final   Culture NO GROWTH 4 DAYS  Final   Report Status PENDING  Incomplete  Culture, blood (Routine X 2) w Reflex to ID Panel     Status: None (Preliminary result)   Collection Time: 06/12/15  6:58 PM  Result Value Ref Range Status   Specimen Description BLOOD LEFT HAND  Final   Special Requests BOTTLES DRAWN AEROBIC AND ANAEROBIC 1CC  Final   Culture NO GROWTH 4 DAYS  Final   Report Status PENDING  Incomplete    Coagulation Studies:  Recent Labs  06/16/15 0515  LABPROT 25.1*  INR 2.30    Urinalysis: No results for input(s): COLORURINE, LABSPEC, PHURINE, GLUCOSEU, HGBUR, BILIRUBINUR, KETONESUR, PROTEINUR, UROBILINOGEN, NITRITE, LEUKOCYTESUR in the last 72 hours.  Invalid input(s): APPERANCEUR    Imaging:  No results  found.   Assessment & Plan: Ms. CALLI TEELE is a 65 y.o. white female with end stage liver disease, alcoholic cirrhosis, hypothyroidism, alcoholism, who was admitted to [facility] on 06/12/2015 for Acute renal failure.  1. Acute renal failure with metabolic acidosis: concerning for acute hepato-renal syndrome. No improvement with albumin administration.  - Check urine  osm - Agree with midodrine.  - start octreotide for next three days.  - Continue to monitor renal function, urine output and serum electrolytes - renally dose all medications - low threshold to start sodium citrate.   2. End Stage Liver Disease: alcoholic cirrhosis. MELD score calculated at 35. Mortality rate of 53% in 3 months.  Now with ascites - Continue furosemide and spironolactone - Check hepatitis and HIV panel - Appreciate GI input.   3. Cellulitis: with leukocytosis, now not on steroids.  - pip/tazo     LOS: 4 Clem Wisenbaker 1/28/201711:51 AM

## 2015-06-16 NOTE — Progress Notes (Signed)
[place] [facility] [place] NAME: Glendoria Servais    MR#:  [id number]  DATE OF BIRTH:  21-Feb-1951  SUBJECTIVE:  CHIEF COMPLAINT:  No chief complaint on file.    Sent by Dr. Tiffany Kocher from clinic due to swelling on legs and redness.   She has liver cirrhosis and was recently admitted and discharged with prednisone.    LFT remains high, pt is drowzy   Her husband  present in room.  REVIEW OF SYSTEMS:  CONSTITUTIONAL: No fever, fatigue or weakness.  EYES: No blurred or double vision.  EARS, NOSE, AND THROAT: No tinnitus or ear pain.  RESPIRATORY: No cough, shortness of breath, wheezing or hemoptysis.  CARDIOVASCULAR: No chest pain, orthopnea, edema.  GASTROINTESTINAL: No nausea, vomiting, diarrhea or abdominal pain.  GENITOURINARY: No dysuria, hematuria.  ENDOCRINE: No polyuria, nocturia,  HEMATOLOGY: No anemia, easy bruising or bleeding SKIN: No rash or lesion. LLE ulcer and redness MUSCULOSKELETAL: No joint pain or arthritis.   NEUROLOGIC: No tingling, numbness, weakness.  PSYCHIATRY: No anxiety or depression.   ROS  DRUG ALLERGIES:   Allergies  Allergen Reactions  . Bee Venom Anaphylaxis  . Advil [Ibuprofen] Nausea And Vomiting  . Aleve [Naproxen Sodium] Nausea And Vomiting  . Betadine [Povidone Iodine] Other (See Comments)    Reaction:  Burning   . Codeine Other (See Comments)    Reaction:  GI upset     VITALS:  Blood pressure 114/53, pulse 75, temperature 97.3 F (36.3 C), temperature source Oral, resp. rate 18, height 5\' 4"  (1.626 m), weight 59.149 kg (130 lb 6.4 oz), SpO2 100 %.  PHYSICAL EXAMINATION:  GENERAL:  65 y.o.-year-old patient lying in the bed with no acute distress.  EYES: Pupils equal, round, reactive to light and accommodation. No scleral icterus. Extraocular muscles intact.  HEENT: Head atraumatic, normocephalic. Oropharynx and nasopharynx clear.  NECK:  Supple, no jugular venous distention. No thyroid  enlargement, no tenderness.  LUNGS: Normal breath sounds bilaterally, no wheezing, rales,rhonchi or crepitation. No use of accessory muscles of respiration.  CARDIOVASCULAR: S1, S2 normal. No murmurs, rubs, or gallops.  ABDOMEN: Soft, mild tender, some distended. Bowel sounds present. No organomegaly or mass.  EXTREMITIES: B/L LE edema NEUROLOGIC: Cranial nerves II through XII are intact. Muscle strength 4/5 in all extremities. Sensation intact. Gait not checked.  PSYCHIATRIC: The patient is alert and oriented x 3.  SKIN: LLE ulcer 3x 2 cm. Serous discharge. Surrounding redness, warm, tender.  Physical Exam LABORATORY PANEL:   CBC  Recent Labs Lab 06/16/15 0515  WBC 26.5*  HGB 6.9*  HCT 20.2*  PLT 139*   ------------------------------------------------------------------------------------------------------------------  Chemistries   Recent Labs Lab 06/16/15 0515  NA 134*  K 4.4  CL 108  CO2 17*  GLUCOSE 88  BUN 94*  CREATININE 2.26*  CALCIUM 8.1*  MG 1.6*  AST 109*  ALT 94*  ALKPHOS 134*  BILITOT 15.7*   ------------------------------------------------------------------------------------------------------------------  Cardiac Enzymes No results for input(s): TROPONINI in the last 168 hours. ------------------------------------------------------------------------------------------------------------------  RADIOLOGY:  No results found.  ASSESSMENT AND PLAN:   Active Problems:   Leg edema   Alcoholic cirrhosis of liver with ascites (HCC)  1. severe bilateral lower extremity edema with superficial ulcers and elevated white count - Blood cultures 2 - On IV Zosyn -White count could have been elevated secondary to by prednisone. - No fever - Monitor WBC count - Negative DVT studies.  2. Severe alcoholic liver cirrhosis with ascitis and  edema -Followed by Dr. Vira Agar - was on tapering steroids. - prognosis extremely poor with MELD score 35  On Aldactone.  Will increase lasix to 80mg  PO BID   palliative care consult to help d/c plan.  3. 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 Hypothyroidism continue Synthroid  5. DVT prophylaxis none since patient has acquired coagulopathy   INR is 2.8  6. 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 Monitor.  7. Anemia of chronic disease- worsening. Transfuse 2 units PRBC. Could also have GI losses  All the records are reviewed and case discussed with Care Management/Social Workerr. Management plans discussed with the patient, family and they are in agreement.  CODE STATUS: FUll.  TOTAL TIME TAKING CARE OF THIS PATIENT: 35 minutes.    POSSIBLE D/C IN 2-3 DAYS, DEPENDING ON CLINICAL CONDITION.  Likely Discharge home with PT although husband is interested in SNF.    Hillary Bow R M.D on 06/16/2015   Between 7am to 6pm - Pager - [phone]  After 6pm go to [email or web address] - password [email or web address] [facility]  Office  [phone]  CC: Primary care physician; [place] Pink, MD  Note: This dictation was prepared with Dragon dictation along with smaller phrase technology. Any transcriptional errors that result from this process are unintentional.

## 2015-06-16 NOTE — Progress Notes (Signed)
Pt s/p blood transfusion. Pt fatigued. She requested alone time since family has been present since 7am this AM. Prior to the blood transfusion patient was weak but desiring to try to stand. RN contacted PT for help but PT was unable to round on pt today. RN spoke with pt and family about receiving the blood transfusion before attempting to stand d/t fatigue and weakness. After blood transfusion, RN asked patient if she would still like to attempt to stand. Pt refused and stated she would rather rest.

## 2015-06-16 NOTE — Progress Notes (Signed)
ANTIBIOTIC CONSULT NOTE - follow up  Pharmacy Consult for Zosyn Day 5 Indication: LE ulcers  Allergies  Allergen Reactions  . Bee Venom Anaphylaxis  . Advil [Ibuprofen] Nausea And Vomiting  . Aleve [Naproxen Sodium] Nausea And Vomiting  . Betadine [Povidone Iodine] Other (See Comments)    Reaction:  Burning   . Codeine Other (See Comments)    Reaction:  GI upset     Patient Measurements: Height: 5\' 4"  (162.6 cm) Weight: 130 lb 6.4 oz (59.149 kg) IBW/kg (Calculated) : 54.7  Vital Signs: Temp: 97.7 F (36.5 C) (01/28 0559) Temp Source: Oral (01/28 0559) BP: 117/60 mmHg (01/28 0559) Pulse Rate: 73 (01/28 0559) Intake/Output from previous day: 01/27 0701 - 01/28 0700 In: [id number] [P.O.:240; I.V.:195; Blood:316; IV Piggyback:50] Out: 1150 [Urine:1150] Intake/Output from this shift:    Labs:  Recent Labs  06/14/15 0610 06/15/15 0528  06/15/15 1209 06/15/15 1713 06/16/15 0103 06/16/15 0515  WBC  --   --   --  28.6*  --   --  26.5*  HGB  --   --   < > 5.7* 5.8* 6.5* 6.9*  PLT  --   --   --  132*  --   --  139*  CREATININE 2.14* 2.25*  --   --   --   --  2.26*  < > = values in this interval not displayed. Estimated Creatinine Clearance: 22.3 mL/min (by C-G formula based on Cr of 2.26).   Microbiology: Recent Results (from the past 720 hour(s))  Culture, blood (routine x 2)     Status: None   Collection Time: 06/08/15  4:16 PM  Result Value Ref Range Status   Specimen Description BLOOD LEFT ARM  Final   Special Requests   Final    BOTTLES DRAWN AEROBIC AND ANAEROBIC 2CCAEROB,3CCANA   Culture NO GROWTH 5 DAYS  Final   Report Status 06/13/2015 FINAL  Final  Culture, blood (routine x 2)     Status: None   Collection Time: 06/08/15  4:16 PM  Result Value Ref Range Status   Specimen Description BLOOD LEFT ASSIST CONTROL  Final   Special Requests   Final    BOTTLES DRAWN AEROBIC AND ANAEROBIC 5CCAEROBIC,2CCANA   Culture NO GROWTH 5 DAYS  Final   Report Status  06/13/2015 FINAL  Final  Culture, blood (Routine X 2) w Reflex to ID Panel     Status: None (Preliminary result)   Collection Time: 06/12/15  6:56 PM  Result Value Ref Range Status   Specimen Description BLOOD RIGHT HAND  Final   Special Requests BOTTLES DRAWN AEROBIC AND ANAEROBIC 1CC  Final   Culture NO GROWTH 4 DAYS  Final   Report Status PENDING  Incomplete  Culture, blood (Routine X 2) w Reflex to ID Panel     Status: None (Preliminary result)   Collection Time: 06/12/15  6:58 PM  Result Value Ref Range Status   Specimen Description BLOOD LEFT HAND  Final   Special Requests BOTTLES DRAWN AEROBIC AND ANAEROBIC 1CC  Final   Culture NO GROWTH 4 DAYS  Final   Report Status PENDING  Incomplete    Medical History: Past Medical History  Diagnosis Date  . Insomnia   . Generalized anxiety disorder   . Other and unspecified hyperlipidemia   . Unspecified hypothyroidism   . Alcohol abuse, episodic   . Eczema herpeticum     Medications:  Scheduled:  . sodium chloride   Intravenous Once  .  furosemide  40 mg Oral BID  . lactulose  10 g Oral Daily  . levothyroxine  75 mcg Oral QAC breakfast  . midodrine  10 mg Oral TID WC  . multivitamin with minerals  1 tablet Oral Daily  . pantoprazole  40 mg Oral Daily  . piperacillin-tazobactam (ZOSYN)  IV  3.375 g Intravenous Q8H  . spironolactone  50 mg Oral Daily   Infusions:    Assessment: 65 y/o F admitted with LE edema and ulcers ordered empiric abx. Patient with palliative care consult, considering hospice.  Plan:  1/28 Scr 2.26  crcl 22.3.  Will continue current regimen of EI Zosyn 3.375gm IV q8h. Follow renal fxn.  Follow up BMP scheduled for 1/30  Roe Coombs, PharmD Pharmacy Resident 06/16/2015

## 2015-06-17 LAB — CBC WITH DIFFERENTIAL/PLATELET
Basophils Absolute: 0 10*3/uL (ref 0–0.1)
Basophils Relative: 0 %
EOS PCT: 1 %
Eosinophils Absolute: 0.2 10*3/uL (ref 0–0.7)
HEMATOCRIT: 22.8 % — AB (ref 35.0–47.0)
Hemoglobin: 7.9 g/dL — ABNORMAL LOW (ref 12.0–16.0)
LYMPHS ABS: 1.5 10*3/uL (ref 1.0–3.6)
Lymphocytes Relative: 6 %
MCH: 32.8 pg (ref 26.0–34.0)
MCHC: 34.6 g/dL (ref 32.0–36.0)
MCV: 94.8 fL (ref 80.0–100.0)
MONOS PCT: 7 %
Monocytes Absolute: 1.7 10*3/uL — ABNORMAL HIGH (ref 0.2–0.9)
NEUTROS ABS: 21.1 10*3/uL — AB (ref 1.4–6.5)
Neutrophils Relative %: 86 %
Platelets: 138 10*3/uL — ABNORMAL LOW (ref 150–440)
RBC: 2.41 MIL/uL — AB (ref 3.80–5.20)
RDW: 24.2 % — AB (ref 11.5–14.5)
WBC: 24.5 10*3/uL — ABNORMAL HIGH (ref 3.6–11.0)

## 2015-06-17 LAB — COMPREHENSIVE METABOLIC PANEL
ALT: 96 U/L — ABNORMAL HIGH (ref 14–54)
AST: 113 U/L — AB (ref 15–41)
Albumin: 1.8 g/dL — ABNORMAL LOW (ref 3.5–5.0)
Alkaline Phosphatase: 130 U/L — ABNORMAL HIGH (ref 38–126)
Anion gap: 12 (ref 5–15)
BILIRUBIN TOTAL: 17.5 mg/dL — AB (ref 0.3–1.2)
BUN: 91 mg/dL — AB (ref 6–20)
CALCIUM: 8.4 mg/dL — AB (ref 8.9–10.3)
CO2: 18 mmol/L — ABNORMAL LOW (ref 22–32)
CREATININE: 2.35 mg/dL — AB (ref 0.44–1.00)
Chloride: 105 mmol/L (ref 101–111)
GFR calc Af Amer: 24 mL/min — ABNORMAL LOW (ref >60)
GFR, EST NON AFRICAN AMERICAN: 21 mL/min — AB (ref >60)
Glucose, Bld: 200 mg/dL — ABNORMAL HIGH (ref 65–99)
POTASSIUM: 4.4 mmol/L (ref 3.5–5.1)
Sodium: 135 mmol/L (ref 135–145)
TOTAL PROTEIN: 6.8 g/dL (ref 6.5–8.1)

## 2015-06-17 LAB — CULTURE, BLOOD (ROUTINE X 2)
CULTURE: NO GROWTH
CULTURE: NO GROWTH

## 2015-06-17 LAB — HEPATITIS B SURFACE ANTIBODY,QUALITATIVE: Hep B S Ab: NONREACTIVE

## 2015-06-17 LAB — HEPATITIS C ANTIBODY

## 2015-06-17 LAB — HEPATITIS B SURFACE ANTIGEN: Hepatitis B Surface Ag: NEGATIVE

## 2015-06-17 LAB — AMMONIA: AMMONIA: 85 umol/L — AB (ref 9–35)

## 2015-06-17 LAB — HEPATITIS B CORE ANTIBODY, IGM: Hep B C IgM: NEGATIVE

## 2015-06-17 LAB — HIV ANTIBODY (ROUTINE TESTING W REFLEX): HIV SCREEN 4TH GENERATION: NONREACTIVE

## 2015-06-17 MED ORDER — LACTULOSE 10 GM/15ML PO SOLN
30.0000 g | Freq: Four times a day (QID) | ORAL | Status: DC
  Administered 2015-06-17 – 2015-06-19 (×9): 30 g via ORAL
  Filled 2015-06-17 (×9): qty 60

## 2015-06-17 MED ORDER — DIPHENHYDRAMINE HCL 25 MG PO CAPS
25.0000 mg | ORAL_CAPSULE | Freq: Four times a day (QID) | ORAL | Status: DC | PRN
  Administered 2015-06-17: 25 mg via ORAL
  Filled 2015-06-17: qty 1

## 2015-06-17 MED ORDER — CITRIC ACID-SODIUM CITRATE 334-500 MG/5ML PO SOLN
30.0000 mL | Freq: Two times a day (BID) | ORAL | Status: DC
  Administered 2015-06-17 – 2015-06-19 (×4): 30 mL via ORAL
  Filled 2015-06-17 (×7): qty 30

## 2015-06-17 MED ORDER — FLEET ENEMA 7-19 GM/118ML RE ENEM
1.0000 | ENEMA | Freq: Once | RECTAL | Status: AC
  Administered 2015-06-17: 1 via RECTAL

## 2015-06-17 NOTE — Progress Notes (Signed)
Pt requested bedpan 4-5 times at end of first shift but unable to void. Bladder scan at 684mls, MD notified and In and Out cath ordered resulting in 700 mls. Pt got Lasix this shift and had the same issue of inability to void, MD notified and order to place foley and leave in given. Foley in place 700 mls out. Pt also having issue with BM, Lactulos admin both shifts but only stool smears noted. Pt noted that her abd hurts and she feels worse since she is unable to have a BM. Family questions whether an enema can be given in the AM.

## 2015-06-17 NOTE — Progress Notes (Signed)
[facility]  ROUNDING NOTE   Subjective:   Family at bedside. Status post 2 units PRBC  Objective:  Vital signs in last 24 hours:  Temp:  [97.3 F (36.3 C)-97.6 F (36.4 C)] 97.4 F (36.3 C) (01/29 0458) Pulse Rate:  [69-80] 70 (01/29 1105) Resp:  [18-20] 18 (01/29 0458) BP: (114-128)/(53-67) 128/67 mmHg (01/29 1105) SpO2:  [98 %-100 %] 98 % (01/29 0458)  Weight change:  Filed Weights   06/12/15 1342  Weight: 59.149 kg (130 lb 6.4 oz)    Intake/Output: I/O last 3 completed shifts: In: [id number] [P.O.:100; Blood:656] Out: 5077 [id number]   Intake/Output this shift:  Total I/O In: 25 [IV Piggyback:25] Out: 400 [Urine:400]  Physical Exam: General: NAD, frail  Head: Normocephalic, atraumatic. Moist oral mucosal membranes  Eyes: +icterus  Neck: Supple, trachea midline  Lungs:  Clear to auscultation  Heart: Regular rate and rhythm  Abdomen:  Soft, nontender, +ascites  Extremities: 1+ peripheral edema.  Neurologic: Nonfocal, moving all four extremities  Skin: jaundice       Basic Metabolic Panel:  Recent Labs Lab 06/13/15 0547 06/14/15 0610 06/15/15 0528 06/16/15 0515 06/17/15 0500  NA 130*  --  130* 134* 135  K 4.0  --  4.0 4.4 4.4  CL 101  --  105 108 105  CO2 19*  --  17* 17* 18*  GLUCOSE 101*  --  89 88 200*  BUN 108*  --  98* 94* 91*  CREATININE 2.42* 2.14* 2.25* 2.26* 2.35*  CALCIUM 8.1*  --  7.5* 8.1* 8.4*  MG  --   --   --  1.6*  --     Liver Function Tests:  Recent Labs Lab 06/13/15 0547 06/15/15 0528 06/16/15 0515 06/17/15 0500  AST 112* 108* 109* 113*  ALT 97* 93* 94* 96*  ALKPHOS 123 110 134* 130*  BILITOT 16.3* 14.4* 15.7* 17.5*  PROT 6.8 6.4* 6.7 6.8  ALBUMIN 1.9* 2.0* 2.0* 1.8*   No results for input(s): LIPASE, AMYLASE in the last 168 hours.  Recent Labs Lab 06/12/15 1511 06/17/15 0500  AMMONIA 52* 85*    CBC:  Recent Labs Lab 06/15/15 1209 06/15/15 1713 06/16/15 0103 06/16/15 0515 06/16/15 1547  06/17/15 0500  WBC 28.6*  --   --  26.5*  --  24.5*  NEUTROABS 24.0*  --   --   --   --  21.1*  HGB 5.7* 5.8* 6.5* 6.9* 7.8* 7.9*  HCT 17.1*  --  19.4* 20.2* 23.1* 22.8*  MCV 102.0*  --   --  97.3  --  94.8  PLT 132*  --   --  139*  --  138*    Cardiac Enzymes: No results for input(s): CKTOTAL, CKMB, CKMBINDEX, TROPONINI in the last 168 hours.  BNP: Invalid input(s): POCBNP  CBG: No results for input(s): GLUCAP in the last 168 hours.  Microbiology: Results for orders placed or performed during the hospital encounter of 06/12/15  Culture, blood (Routine X 2) w Reflex to ID Panel     Status: None   Collection Time: 06/12/15  6:56 PM  Result Value Ref Range Status   Specimen Description BLOOD RIGHT HAND  Final   Special Requests BOTTLES DRAWN AEROBIC AND ANAEROBIC [place]  Final   Culture NO GROWTH 5 DAYS  Final   Report Status 06/17/2015 FINAL  Final  Culture, blood (Routine X 2) w Reflex to ID Panel     Status: None   Collection Time: 06/12/15  6:58 PM  Result Value Ref Range Status   Specimen Description BLOOD LEFT HAND  Final   Special Requests BOTTLES DRAWN AEROBIC AND ANAEROBIC 1CC  Final   Culture NO GROWTH 5 DAYS  Final   Report Status 06/17/2015 FINAL  Final    Coagulation Studies:  Recent Labs  06/16/15 0515  LABPROT 25.1*  INR 2.30    Urinalysis: No results for input(s): COLORURINE, LABSPEC, PHURINE, GLUCOSEU, HGBUR, BILIRUBINUR, KETONESUR, PROTEINUR, UROBILINOGEN, NITRITE, LEUKOCYTESUR in the last 72 hours.  Invalid input(s): APPERANCEUR    Imaging: No results found.   Medications:     . sodium chloride   Intravenous Once  . furosemide  80 mg Oral BID  . lactulose  30 g Oral Q6H  . levothyroxine  75 mcg Oral QAC breakfast  . midodrine  10 mg Oral TID WC  . multivitamin with minerals  1 tablet Oral Daily  . octreotide  100 mcg Subcutaneous TID  . pantoprazole  40 mg Oral Daily  . piperacillin-tazobactam (ZOSYN)  IV  3.375 g Intravenous Q8H  .  spironolactone  50 mg Oral Daily   diphenhydrAMINE, ondansetron **OR** ondansetron (ZOFRAN) IV, oxyCODONE  Assessment/ Plan:  Anne Hickman is a 65 y.o. white female with end stage liver disease, alcoholic cirrhosis, hypothyroidism, alcoholism, who was admitted to [facility] on 06/12/2015 for Acute renal failure.  1. Acute renal failure with metabolic acidosis: concerning for acute hepato-renal syndrome. No improvement with albumin administration.  - Agree with midodrine. Started on octreotide  - Continue to monitor renal function, urine output and serum electrolytes - renally dose all medications - Start sodium citrate.   2. End Stage Liver Disease: alcoholic cirrhosis. MELD score calculated at 35. Mortality rate of 53% in 3 months. HIV negative, negative for viral hepatitis Now with ascites - Continue furosemide and spironolactone - Appreciate GI input.   3. Cellulitis: with leukocytosis, now not on steroids.  - pip/tazo   LOS: 5 Anne Hickman 1/29/201712:00 PM

## 2015-06-17 NOTE — Progress Notes (Signed)
[place] at [place] NAME: Briahna Mahdavi    MR#:  [id number]  DATE OF BIRTH:  May 02, 1951  SUBJECTIVE:  CHIEF COMPLAINT:  No chief complaint on file.    Sent by Dr. Tiffany Kocher from clinic due to swelling on legs and redness. She has liver cirrhosis and was recently admitted and discharged with prednisone.  LFT remains high, pt is drowzy. Her husband  present in room.  REVIEW OF SYSTEMS:  CONSTITUTIONAL: No fever, fatigue or weakness.  EYES: No blurred or double vision.  EARS, NOSE, AND THROAT: No tinnitus or ear pain.  RESPIRATORY: No cough, shortness of breath, wheezing or hemoptysis.  CARDIOVASCULAR: No chest pain, orthopnea, edema.  GASTROINTESTINAL: No nausea, vomiting, diarrhea or abdominal pain.  GENITOURINARY: No dysuria, hematuria.  ENDOCRINE: No polyuria, nocturia,  HEMATOLOGY: No anemia, easy bruising or bleeding SKIN: No rash or lesion. LLE ulcer and redness MUSCULOSKELETAL: No joint pain or arthritis.   NEUROLOGIC: No tingling, numbness, weakness.  PSYCHIATRY: No anxiety or depression.   ROS  DRUG ALLERGIES:   Allergies  Allergen Reactions  . Bee Venom Anaphylaxis  . Advil [Ibuprofen] Nausea And Vomiting  . Aleve [Naproxen Sodium] Nausea And Vomiting  . Betadine [Povidone Iodine] Other (See Comments)    Reaction:  Burning   . Codeine Other (See Comments)    Reaction:  GI upset     VITALS:  Blood pressure 128/67, pulse 70, temperature 97.4 F (36.3 C), temperature source Oral, resp. rate 18, height 5\' 4"  (1.626 m), weight 59.149 kg (130 lb 6.4 oz), SpO2 98 %.  PHYSICAL EXAMINATION:  GENERAL:  65 y.o.-year-old patient lying in the bed with no acute distress.  EYES: Pupils equal, round, reactive to light and accommodation. No scleral icterus. Extraocular muscles intact.  HEENT: Head atraumatic, normocephalic. Oropharynx and nasopharynx clear.  NECK:  Supple, no jugular venous distention. No thyroid  enlargement, no tenderness.  LUNGS: Normal breath sounds bilaterally, no wheezing, rales,rhonchi or crepitation. No use of accessory muscles of respiration. CARDIOVASCULAR: S1, S2 normal. No murmurs, rubs, or gallops. ABDOMEN: Soft, mild tender, some distended. Bowel sounds present. No organomegaly or mass.  EXTREMITIES: B/L LE edema. NEUROLOGIC: Cranial nerves II through XII are intact. Muscle strength 4/5 in all extremities. Sensation intact. Gait not checked. PSYCHIATRIC: The patient is drowzy SKIN: LLE ulcer 3x 2 cm. Surrounding redness, warm, tender.  Physical Exam LABORATORY PANEL:   CBC  Recent Labs Lab 06/17/15 0500  WBC 24.5*  HGB 7.9*  HCT 22.8*  PLT 138*   ------------------------------------------------------------------------------------------------------------------  Chemistries   Recent Labs Lab 06/16/15 0515 06/17/15 0500  NA 134* 135  K 4.4 4.4  CL 108 105  CO2 17* 18*  GLUCOSE 88 200*  BUN 94* 91*  CREATININE 2.26* 2.35*  CALCIUM 8.1* 8.4*  MG 1.6*  --   AST 109* 113*  ALT 94* 96*  ALKPHOS 134* 130*  BILITOT 15.7* 17.5*   ------------------------------------------------------------------------------------------------------------------  Cardiac Enzymes No results for input(s): TROPONINI in the last 168 hours. ------------------------------------------------------------------------------------------------------------------  RADIOLOGY:  No results found.  ASSESSMENT AND PLAN:   Active Problems:   Leg edema   Alcoholic cirrhosis of liver with ascites (HCC)  1. severe bilateral lower extremity edema with superficial ulcers and elevated white count - improving - Blood cultures - NGTD - On IV Zosyn - No fever - Monitor WBC count - Negative DVT studies.  2. Severe alcoholic liver cirrhosis with ascitis and edema - worseing Bilirubin -Followed by  Dr. Vira Agar - was on tapering steroids. - prognosis extremely poor with MELD score 35  On  Aldactone. Increased lasix to 80mg  PO BID   palliative care consult to help d/c plan.  3. 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 Hypothyroidism continue Synthroid  5. DVT prophylaxis none since patient has acquired coagulopathy   INR is 2.8  6. 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 Monitor. Appreciate Nephrology help  7. Anemia of chronic disease- worsening. Transfused 2 units PRBC. Could also have GI losses  8. Hepatic encephaopathy Started lactulose  All the records are reviewed and case discussed with Care Management/Social Workerr. Management plans discussed with the patient, family and they are in agreement.  CODE STATUS: DNR  Discussed with SON and Husband at bedside. Code status changed to DNR/DNI. orders entered.  TOTAL TIME TAKING CARE OF THIS PATIENT: 35 minutes.    POSSIBLE D/C IN 1-2 DAYS, DEPENDING ON CLINICAL CONDITION.  Likely Discharge home with Hospice.   Hillary Bow R M.D on 06/17/2015   Between 7am to 6pm - Pager - [phone]  After 6pm go to [email or web address] - password [email or web address] [facility]  Office  [phone]  CC: Primary care physician; [place] Pink, MD  Note: This dictation was prepared with Dragon dictation along with smaller phrase technology. Any transcriptional errors that result from this process are unintentional.

## 2015-06-18 LAB — COMPREHENSIVE METABOLIC PANEL
ALT: 91 U/L — AB (ref 14–54)
ANION GAP: 10 (ref 5–15)
AST: 100 U/L — ABNORMAL HIGH (ref 15–41)
Albumin: 1.7 g/dL — ABNORMAL LOW (ref 3.5–5.0)
Alkaline Phosphatase: 123 U/L (ref 38–126)
BUN: 85 mg/dL — ABNORMAL HIGH (ref 6–20)
CHLORIDE: 106 mmol/L (ref 101–111)
CO2: 22 mmol/L (ref 22–32)
Calcium: 8.5 mg/dL — ABNORMAL LOW (ref 8.9–10.3)
Creatinine, Ser: 2.31 mg/dL — ABNORMAL HIGH (ref 0.44–1.00)
GFR calc non Af Amer: 21 mL/min — ABNORMAL LOW (ref >60)
GFR, EST AFRICAN AMERICAN: 25 mL/min — AB (ref >60)
Glucose, Bld: 103 mg/dL — ABNORMAL HIGH (ref 65–99)
POTASSIUM: 4.2 mmol/L (ref 3.5–5.1)
SODIUM: 138 mmol/L (ref 135–145)
Total Bilirubin: 18.6 mg/dL (ref 0.3–1.2)
Total Protein: 6.6 g/dL (ref 6.5–8.1)

## 2015-06-18 LAB — CBC WITH DIFFERENTIAL/PLATELET
Basophils Absolute: 0.2 10*3/uL — ABNORMAL HIGH (ref 0–0.1)
Basophils Relative: 1 %
Eosinophils Absolute: 0.4 10*3/uL (ref 0–0.7)
Eosinophils Relative: 1 %
HEMATOCRIT: 21.6 % — AB (ref 35.0–47.0)
HEMOGLOBIN: 7.7 g/dL — AB (ref 12.0–16.0)
LYMPHS ABS: 1.8 10*3/uL (ref 1.0–3.6)
LYMPHS PCT: 7 %
MCH: 33.6 pg (ref 26.0–34.0)
MCHC: 35.6 g/dL (ref 32.0–36.0)
MCV: 94.4 fL (ref 80.0–100.0)
MONOS PCT: 6 %
Monocytes Absolute: 1.6 10*3/uL — ABNORMAL HIGH (ref 0.2–0.9)
NEUTROS ABS: 24.4 10*3/uL — AB (ref 1.4–6.5)
NEUTROS PCT: 85 %
Platelets: 152 10*3/uL (ref 150–440)
RBC: 2.28 MIL/uL — ABNORMAL LOW (ref 3.80–5.20)
RDW: 25 % — ABNORMAL HIGH (ref 11.5–14.5)
WBC: 28.3 10*3/uL — ABNORMAL HIGH (ref 3.6–11.0)

## 2015-06-18 MED ORDER — AMOXICILLIN-POT CLAVULANATE 500-125 MG PO TABS
1.0000 | ORAL_TABLET | Freq: Two times a day (BID) | ORAL | Status: DC
  Administered 2015-06-18 – 2015-06-19 (×2): 500 mg via ORAL
  Filled 2015-06-18 (×2): qty 1

## 2015-06-18 MED ORDER — BISACODYL 10 MG RE SUPP: 10.0000 mg | Freq: Every day | RECTAL | Status: DC | PRN

## 2015-06-18 MED ORDER — LORAZEPAM 0.5 MG PO TABS: 0.5000 mg | ORAL_TABLET | Freq: Four times a day (QID) | ORAL | Status: DC | PRN

## 2015-06-18 NOTE — Progress Notes (Signed)
Physical Therapy Treatment Patient Details Name: Anne Hickman MRN: [id number] DOB: [birth year] Today's Date: 06/18/2015    History of Present Illness Pt admitted for renal failure and liver failure. Pt with complaints of weakness along with B LE swelling. Pt with history of cirrhosis of liver along with GERD and alcohol abuse. Pt with recent admission to hospital for similar symptoms.     PT Comments    Pt is very lethargic throughout PT session today. She continually falls asleep and requires heavy verbal input from therapist and husband to remain awake enough to complete exercises. Even with repeated stimulation she falls asleep during exercises. Pt is far too lethargic to attempt transfers or ambulation. Will attempt increase in mobility during next therapy session. Husband instructed in bed exercises and will be provided a handout with exercises he can continue at home. Pt will benefit from skilled PT services to address deficits in strength, balance, and mobility in order to return to full function at home.    Follow Up Recommendations  Home health PT     Equipment Recommendations  Rolling walker with 5" wheels    Recommendations for Other Services       Precautions / Restrictions Precautions Precautions: None Restrictions Weight Bearing Restrictions: No    Mobility  Bed Mobility               General bed mobility comments: Deferred due to somnolence and lethargy. Pt refuses.   Transfers                 General transfer comment: Deferred due to somnolence and lethargy. Pt refuses.  Ambulation/Gait             General Gait Details: Deferred due to somnolence and lethargy. Pt refuses.   Stairs            Wheelchair Mobility    Modified Rankin (Stroke Patients Only)       Balance                                    Cognition Arousal/Alertness: Lethargic Behavior During Therapy: Flat affect Overall Cognitive Status:  Within Functional Limits for tasks assessed                      Exercises General Exercises - Lower Extremity Ankle Circles/Pumps: AROM;Both;10 reps;Supine Quad Sets: Strengthening;Both;10 reps;Supine Gluteal Sets: Strengthening;Both;10 reps;Supine Short Arc Quad: Strengthening;Both;10 reps;Supine Heel Slides: Strengthening;Both;10 reps;Supine Hip ABduction/ADduction: Strengthening;Both;10 reps;Supine Straight Leg Raises: Strengthening;Both;10 reps;Supine    General Comments        Pertinent Vitals/Pain Pain Assessment: No/denies pain    Home Living                      Prior Function            PT Goals (current goals can now be found in the care plan section) Acute Rehab PT Goals Patient Stated Goal: to go home PT Goal Formulation: With patient Time For Goal Achievement: 06/28/15 Potential to Achieve Goals: Fair Progress towards PT goals: Progressing toward goals    Frequency  Min 2X/week    PT Plan Current plan remains appropriate    Co-evaluation             End of Session   Activity Tolerance: Patient limited by lethargy Patient left: in bed;with call bell/phone within reach;with bed alarm  set;with family/visitor present     Time: 1120-1130 PT Time Calculation (min) (ACUTE ONLY): 10 min  Charges:  $Therapeutic Exercise: 8-22 mins                    G Codes:      Lyndel Safe Paula Busenbark PT, DPT   Damario Gillie 06/18/2015, 1:22 PM

## 2015-06-18 NOTE — Progress Notes (Signed)
[place] at [place] NAME: Anne Hickman    MR#:  [id number]  DATE OF BIRTH:  1950/10/19  SUBJECTIVE:  CHIEF COMPLAINT:  No chief complaint on file.    Sent by Dr. Tiffany Kocher from clinic due to swelling on legs and redness. She has liver cirrhosis and was recently admitted and discharged with prednisone.  LFT remains high, pt is drowzy. Her husband  present in room. Edema better.  REVIEW OF SYSTEMS:  CONSTITUTIONAL: Fatigue EYES: No blurred or double vision.  EARS, NOSE, AND THROAT: No tinnitus or ear pain.  RESPIRATORY: No cough, shortness of breath, wheezing or hemoptysis.  CARDIOVASCULAR: No chest pain, orthopnea, edema.  GASTROINTESTINAL: No nausea, vomiting,  abdominal pain. Diarrhea due to lactulose. GENITOURINARY: No dysuria, hematuria.  ENDOCRINE: No polyuria, nocturia,  HEMATOLOGY: No anemia, easy bruising or bleeding SKIN: No rash or lesion. LLE ulcer and redness. Bruising. MUSCULOSKELETAL: No joint pain or arthritis.   NEUROLOGIC: No tingling, numbness, weakness.  PSYCHIATRY: No anxiety or depression.   ROS  DRUG ALLERGIES:   Allergies  Allergen Reactions  . Bee Venom Anaphylaxis  . Advil [Ibuprofen] Nausea And Vomiting  . Aleve [Naproxen Sodium] Nausea And Vomiting  . Betadine [Povidone Iodine] Other (See Comments)    Reaction:  Burning   . Codeine Other (See Comments)    Reaction:  GI upset     VITALS:  Blood pressure 115/53, pulse 77, temperature 97.8 F (36.6 C), temperature source Oral, resp. rate 20, height 5\' 4"  (1.626 m), weight 59.149 kg (130 lb 6.4 oz), SpO2 96 %.  PHYSICAL EXAMINATION:  GENERAL:  65 y.o.-year-old patient lying in the bed with no acute distress.  EYES: Pupils equal, round, reactive to light and accommodation. No scleral icterus. Extraocular muscles intact.  HEENT: Head atraumatic, normocephalic. Oropharynx and nasopharynx clear.  NECK:  Supple, no jugular venous distention. No  thyroid enlargement, no tenderness.  LUNGS: Normal breath sounds bilaterally, no wheezing, rales,rhonchi or crepitation. No use of accessory muscles of respiration. CARDIOVASCULAR: S1, S2 normal. No murmurs, rubs, or gallops. ABDOMEN: Soft, mild tender, some distended. Bowel sounds present. No organomegaly or mass.  EXTREMITIES: B/L LE edema. NEUROLOGIC: Cranial nerves II through XII are intact. Muscle strength 4/5 in all extremities. Sensation intact. Gait not checked. PSYCHIATRIC: The patient is drowzy SKIN: LLE ulcer 3x 2 cm. Surrounding redness, warm, tender.  Physical Exam LABORATORY PANEL:   CBC  Recent Labs Lab 06/18/15 0431  WBC 28.3*  HGB 7.7*  HCT 21.6*  PLT 152   ------------------------------------------------------------------------------------------------------------------  Chemistries   Recent Labs Lab 06/16/15 0515  06/18/15 0431  NA 134*  < > 138  K 4.4  < > 4.2  CL 108  < > 106  CO2 17*  < > 22  GLUCOSE 88  < > 103*  BUN 94*  < > 85*  CREATININE 2.26*  < > 2.31*  CALCIUM 8.1*  < > 8.5*  MG 1.6*  --   --   AST 109*  < > 100*  ALT 94*  < > 91*  ALKPHOS 134*  < > 123  BILITOT 15.7*  < > 18.6*  < > = values in this interval not displayed. ------------------------------------------------------------------------------------------------------------------  Cardiac Enzymes No results for input(s): TROPONINI in the last 168 hours. ------------------------------------------------------------------------------------------------------------------  RADIOLOGY:  No results found.  ASSESSMENT AND PLAN:   Active Problems:   Leg edema   Alcoholic cirrhosis of liver with ascites (HCC)  1. severe  bilateral lower extremity edema with superficial ulcers and elevated white count - improving - Blood cultures - NGTD - On IV Zosyn. Will change to Augmentin today. - Negative DVT studies.  2. Severe alcoholic liver cirrhosis with ascitis and edema - worseing  Bilirubin -Followed by Dr. Vira Agar - prognosis extremely poor  On Aldactone. Increased lasix to 80mg  PO BID  3. 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 Hypothyroidism continue Synthroid  5. DVT prophylaxis none since patient has acquired coagulopathy   INR is 2.8  6. 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 Monitor. Appreciate Nephrology help  7. Anemia of chronic disease- worsening. Transfused 2 units PRBC. Could also have GI losses  8. Hepatic encephaopathy Started lactulose  All the records are reviewed and case discussed with Care Management/Social Workerr. Management plans discussed with the patient, family and they are in agreement.  CODE STATUS: DNR  Very poor prognosis. Likely d/c home with hospice. Discussed with Dr. Megan Salon and Dr. Vira Agar  TOTAL TIME TAKING CARE OF THIS PATIENT: 35 minutes.  >50% time spent in co-ordiating care and discussing.   POSSIBLE D/C IN 1-2 DAYS, DEPENDING ON CLINICAL CONDITION.  Likely Discharge home with Hospice tomorrow   Hillary Bow R M.D on 06/18/2015   Between 7am to 6pm - Pager - [phone]  After 6pm go to [email or web address] - password [email or web address] [facility]  Office  [phone]  CC: Primary care physician; [place] Pink, MD  Note: This dictation was prepared with Dragon dictation along with smaller phrase technology. Any transcriptional errors that result from this process are unintentional.

## 2015-06-18 NOTE — Progress Notes (Signed)
Nutrition Follow-up     INTERVENTION:  Meals and snacks: cater to pt preferences Medical nutrition Supplement Therapy: will add [email or web address] for added nutritionj   NUTRITION DIAGNOSIS:   Increased nutrient needs related to chronic illness as evidenced by estimated needs.    GOAL:   Patient will meet greater than or equal to 90% of their needs    MONITOR:    (Energy intake, Electrolyte and renal profile)  REASON FOR ASSESSMENT:   Diagnosis    ASSESSMENT:      Palliative care following and possible discharge with home hospice care   Current Nutrition: 0-100% noted on I and O flowsheet   Gastrointestinal Profile: Last BM: 1/30   Scheduled Medications:  . citric acid-sodium citrate  30 mL Oral BID PC  . furosemide  80 mg Oral BID  . lactulose  30 g Oral Q6H  . levothyroxine  75 mcg Oral QAC breakfast  . midodrine  10 mg Oral TID WC  . multivitamin with minerals  1 tablet Oral Daily  . octreotide  100 mcg Subcutaneous TID  . pantoprazole  40 mg Oral Daily  . spironolactone  50 mg Oral Daily       Electrolyte/Renal Profile and Glucose Profile:   Recent Labs Lab 06/16/15 0515 06/17/15 0500 06/18/15 0431  NA 134* 135 138  K 4.4 4.4 4.2  CL 108 105 106  CO2 17* 18* 22  BUN 94* 91* 85*  CREATININE 2.26* 2.35* 2.31*  CALCIUM 8.1* 8.4* 8.5*  MG 1.6*  --   --   GLUCOSE 88 200* 103*   Protein Profile:  Recent Labs Lab 06/16/15 0515 06/17/15 0500 06/18/15 0431  ALBUMIN 2.0* 1.8* 1.7*      Weight Trend since Admission: Filed Weights   06/12/15 1342  Weight: 130 lb 6.4 oz (59.149 kg)      Diet Order:  DIET SOFT Room service appropriate?: Yes; Fluid consistency:: Thin  Skin:   reviewed  Last BM:     Height:   Ht Readings from Last 1 Encounters:  06/12/15 5\' 4"  (1.626 m)    Weight:   Wt Readings from Last 1 Encounters:  06/12/15 130 lb 6.4 oz (59.149 kg)    Ideal Body Weight:     BMI:  Body mass index is 22.37  kg/(m^2).  Estimated Nutritional Needs:   Kcal:  BEE 1135 kcals (IF 1.1-1.3, AF 1.3) [id number] kcals/d  Protein:  65-77 g/d (1.1-1.3 g/kg)  Fluid:  1475-1731ml/d (25-58ml/kg)   EDUCATION NEEDS:   No education needs identified at this time  [place]. Zenia Resides, [place], [place] (pager) Weekend/On-Call pager [phone])

## 2015-06-18 NOTE — Progress Notes (Signed)
Palliative Medicine Inpatient Consult Follow Up Note   Name: Anne Hickman Date: 06/18/2015 MRN: [id number]  DOB: [birth year]  Referring Physician: Hillary Bow, MD  Palliative Care consult requested for this 65 y.o. female for goals of medical therapy in patient with advanced alcoholic cirrhosis.   TODAY'S DISCUSSIONS AND DECISIONS:  1.  Pt is DNR status.  2.  I met with pts husband b/c it is apparent that pt is declining even further since Friday.  Her attending thought she might even be appropriate for terminal hospice care in some setting (possibly even Hospice Home).  This would be a change from the concept of longer term Hospice Care in the home setting.  Neither of these had been discussed in detail with the pt's husband, though I had spent a long time talking with pts three adult children on Friday about these matters.   3.  The pt's husband was tearful but recognized the reality of her poor to grim prognosis.  He wanted information so I discussed various options including Hospice in the home setting and Hospice Home itself.  He said he would really prefer her to be in her own home but he would welcome Hospice there and he understands the philosophy of trying to manage most things that come up in the home rather than having pt live or return often to the hospital.    4.  However, the pts husband wanted to make sure that Dr. Vira Agar didn't have something else to offer her.  So, I waited to see what Dr. Vira Agar might say to him. I have reviewed Dr. Percell Boston note and he has gone over her condition, describing her MELD score and prognosis based on data.  She is to avoid significant diuresis as this shifts ammonia into the CNS. He has ordered a foley b/c of difficulty urinating (but pt wants it out). Pt has had three BMs today --perhaps pressure of ascites is causing retention --unclear at this point.  BM volume may not be adequate (possibly causing an effect of 'relative constipation').  Dr  Tiffany Kocher did not have any other curative approaches and family will want to proceed with Hospice care in the home.  Pt's husband told me he will take FMLA to be home with her.  The adult children told me on Friday that they can fly down from [place] and help out and can also help financially to get outside help from time to time to 'fill in the gaps'. They do not feel this is an undue burden. They all seem to be very supportive of pt and pt's husband.  5.  Will ask for Care Mgr to offer choice to family and then offer a consult for Hospice in the Home.    IMPRESSION: 1. Alcoholic Cirrhosis ----pertinent labs: Total bili 16.3 -14.4 now. Cr 2.49 - 2.25 now INR 3.56 on 1/12 and 2.80 now. NH4 52 on 1/24. Positive for presence of ascites described as moderate on CT scan from 1/20. Sodium 130. No dialysis has been done as far as I can ascertain. Albumin 2.0.  This gives her a MELD score of 53% 3 month mortality.  2. 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 Leg ulcers--superficial but thought to be infected and being treated with Zosyn 4. Alcoholism 5. Hypothyroidism 6. Dyslipidemia 7. Anxiety 8. Acquired coagulopathy due to cirrhosis 9. Increased leg edema and tremors related to liver disease ' 10. Eczema 11. Leukocytosis 12. Hepatic encephalopathy 13. Anemia --severe  drop if true lab value (now showing hgb of 5.8 where it had been 8.2 on 1/20/ 17.  14. Probable depression   REVIEW OF SYSTEMS:  Patient is not able to provide ROS due to hepatic encephalopathy  CODE STATUS: DNR   PAST MEDICAL HISTORY: Past Medical History  Diagnosis Date  . Insomnia   . Generalized anxiety disorder   . Other and unspecified hyperlipidemia   . Unspecified hypothyroidism   . Alcohol abuse, episodic   . Eczema herpeticum     PAST SURGICAL HISTORY:  Past Surgical History  Procedure Laterality Date  . Appendectomy  1978  . Abdominal  hysterectomy      Partial   . Esophagogastroduodenoscopy (egd) with propofol N/A 12/13/2014    Procedure: ESOPHAGOGASTRODUODENOSCOPY (EGD) WITH PROPOFOL;  Surgeon: Manya Silvas, MD;  Location: [facility];  Service: Endoscopy;  Laterality: N/A;    Vital Signs: BP 115/52 mmHg  Pulse 69  Temp(Src) 97.7 F (36.5 C) (Oral)  Resp 17  Ht 5' 4"  (1.626 m)  Wt 59.149 kg (130 lb 6.4 oz)  BMI 22.37 kg/m2  SpO2 99% Filed Weights   06/12/15 1342  Weight: 59.149 kg (130 lb 6.4 oz)    Estimated body mass index is 22.37 kg/(m^2) as calculated from the following:   Height as of this encounter: 5' 4"  (1.626 m).   Weight as of this encounter: 59.149 kg (130 lb 6.4 oz).  PHYSICAL EXAM: Sleeping --lethargic Eyes closed No JVD or TM Hrt rrr no mgr Lungs decreased BS in bases Abd protuberant with BS Ext no mottling or cyanosis Skin warm and dry   LABS: CBC:    Component Value Date/Time   WBC 28.3* 06/18/2015 0431   HGB 7.7* 06/18/2015 0431   HCT 21.6* 06/18/2015 0431   PLT 152 06/18/2015 0431   MCV 94.4 06/18/2015 0431   NEUTROABS 24.4* 06/18/2015 0431   LYMPHSABS 1.8 06/18/2015 0431   MONOABS 1.6* 06/18/2015 0431   EOSABS 0.4 06/18/2015 0431   BASOSABS 0.2* 06/18/2015 0431   Comprehensive Metabolic Panel:    Component Value Date/Time   NA 138 06/18/2015 0431   K 4.2 06/18/2015 0431   CL 106 06/18/2015 0431   CO2 22 06/18/2015 0431   BUN 85* 06/18/2015 0431   CREATININE 2.31* 06/18/2015 0431   GLUCOSE 103* 06/18/2015 0431   CALCIUM 8.5* 06/18/2015 0431   AST 100* 06/18/2015 0431   ALT 91* 06/18/2015 0431   ALKPHOS 123 06/18/2015 0431   BILITOT 18.6* 06/18/2015 0431   PROT 6.6 06/18/2015 0431   ALBUMIN 1.7* 06/18/2015 0431     More than 50% of the visit was spent in counseling/coordination of care: YES  Time Spent:  65 min

## 2015-06-18 NOTE — Progress Notes (Signed)
[facility]  ROUNDING NOTE   Subjective:   Husband at bedside Denies any acute c/o  Objective:  Vital signs in last 24 hours:  Temp:  [97.4 F (36.3 C)-98.2 F (36.8 C)] 97.8 F (36.6 C) (01/30 0900) Pulse Rate:  [76-79] 77 (01/30 0900) Resp:  [17-20] 20 (01/30 0900) BP: (105-127)/(49-61) 115/53 mmHg (01/30 0900) SpO2:  [96 %-100 %] 96 % (01/30 0900)  Weight change:  Filed Weights   06/12/15 1342  Weight: 59.149 kg (130 lb 6.4 oz)    Intake/Output: I/O last 3 completed shifts: In: 77 [P.O.:700; IV Piggyback:60] Out: 3800 [Urine:3800]   Intake/Output this shift:  Total I/O In: 34 [IV Piggyback:34] Out: 425 [Urine:425]  Physical Exam: General: NAD, frail  Head: Normocephalic, atraumatic. Moist oral mucosal membranes  Eyes: +icterus  Neck: Supple, trachea midline  Lungs:  Clear to auscultation  Heart: Regular rate and rhythm  Abdomen:  Soft, nontender, +ascites  Extremities: 1+ peripheral edema.  Neurologic: Nonfocal, moving all four extremities  Skin: jaundice       Basic Metabolic Panel:  Recent Labs Lab 06/13/15 0547 06/14/15 0610 06/15/15 0528 06/16/15 0515 06/17/15 0500 06/18/15 0431  NA 130*  --  130* 134* 135 138  K 4.0  --  4.0 4.4 4.4 4.2  CL 101  --  105 108 105 106  CO2 19*  --  17* 17* 18* 22  GLUCOSE 101*  --  89 88 200* 103*  BUN 108*  --  98* 94* 91* 85*  CREATININE 2.42* 2.14* 2.25* 2.26* 2.35* 2.31*  CALCIUM 8.1*  --  7.5* 8.1* 8.4* 8.5*  MG  --   --   --  1.6*  --   --     Liver Function Tests:  Recent Labs Lab 06/13/15 0547 06/15/15 0528 06/16/15 0515 06/17/15 0500 06/18/15 0431  AST 112* 108* 109* 113* 100*  ALT 97* 93* 94* 96* 91*  ALKPHOS 123 110 134* 130* 123  BILITOT 16.3* 14.4* 15.7* 17.5* 18.6*  PROT 6.8 6.4* 6.7 6.8 6.6  ALBUMIN 1.9* 2.0* 2.0* 1.8* 1.7*   No results for input(s): LIPASE, AMYLASE in the last 168 hours.  Recent Labs Lab 06/12/15 1511 06/17/15 0500  AMMONIA 52* 85*     CBC:  Recent Labs Lab 06/15/15 1209  06/16/15 0103 06/16/15 0515 06/16/15 1547 06/17/15 0500 06/18/15 0431  WBC 28.6*  --   --  26.5*  --  24.5* 28.3*  NEUTROABS 24.0*  --   --   --   --  21.1* 24.4*  HGB 5.7*  < > 6.5* 6.9* 7.8* 7.9* 7.7*  HCT 17.1*  --  19.4* 20.2* 23.1* 22.8* 21.6*  MCV 102.0*  --   --  97.3  --  94.8 94.4  PLT 132*  --   --  139*  --  138* 152  < > = values in this interval not displayed.  Cardiac Enzymes: No results for input(s): CKTOTAL, CKMB, CKMBINDEX, TROPONINI in the last 168 hours.  BNP: Invalid input(s): POCBNP  CBG: No results for input(s): GLUCAP in the last 168 hours.  Microbiology: Results for orders placed or performed during the hospital encounter of 06/12/15  Culture, blood (Routine X 2) w Reflex to ID Panel     Status: None   Collection Time: 06/12/15  6:56 PM  Result Value Ref Range Status   Specimen Description BLOOD RIGHT HAND  Final   Special Requests BOTTLES DRAWN AEROBIC AND ANAEROBIC [place]  Final  Culture NO GROWTH 5 DAYS  Final   Report Status 06/17/2015 FINAL  Final  Culture, blood (Routine X 2) w Reflex to ID Panel     Status: None   Collection Time: 06/12/15  6:58 PM  Result Value Ref Range Status   Specimen Description BLOOD LEFT HAND  Final   Special Requests BOTTLES DRAWN AEROBIC AND ANAEROBIC 1CC  Final   Culture NO GROWTH 5 DAYS  Final   Report Status 06/17/2015 FINAL  Final    Coagulation Studies:  Recent Labs  06/16/15 0515  LABPROT 25.1*  INR 2.30    Urinalysis: No results for input(s): COLORURINE, LABSPEC, PHURINE, GLUCOSEU, HGBUR, BILIRUBINUR, KETONESUR, PROTEINUR, UROBILINOGEN, NITRITE, LEUKOCYTESUR in the last 72 hours.  Invalid input(s): APPERANCEUR    Imaging: No results found.   Medications:     . sodium chloride   Intravenous Once  . citric acid-sodium citrate  30 mL Oral BID PC  . furosemide  80 mg Oral BID  . lactulose  30 g Oral Q6H  . levothyroxine  75 mcg Oral QAC breakfast   . midodrine  10 mg Oral TID WC  . multivitamin with minerals  1 tablet Oral Daily  . octreotide  100 mcg Subcutaneous TID  . pantoprazole  40 mg Oral Daily  . piperacillin-tazobactam (ZOSYN)  IV  3.375 g Intravenous Q8H  . spironolactone  50 mg Oral Daily   diphenhydrAMINE, ondansetron **OR** ondansetron (ZOFRAN) IV, oxyCODONE  Assessment/ Plan:  [name] is a 65 y.o. white female with end stage liver disease, alcoholic cirrhosis, hypothyroidism, alcoholism, who was admitted to [facility] on 06/12/2015 for Acute renal failure.  1. Acute renal failure with metabolic acidosis: concerning for acute hepato-renal syndrome. No improvement with albumin administration.  - Agree with midodrine. Started on octreotide  - Continue to monitor renal function, urine output and serum electrolytes - renally dose all medications  2. End Stage Liver Disease: alcoholic cirrhosis. MELD score calculated at 35. Mortality rate of 53% in 3 months. HIV negative, negative for viral hepatitis Now with ascites - Continue furosemide and spironolactone    3. Cellulitis: with leukocytosis, now not on steroids.  - pip/tazo   LOS: 6 Anne Hickman 1/30/201712:59 PM

## 2015-06-18 NOTE — Plan of Care (Signed)
Problem: Safety: Goal: Ability to remain free from injury will improve Outcome: Progressing Pt has remained free from falls during my care.   Problem: Pain Managment: Goal: General experience of comfort will improve Outcome: Progressing Pt has complained no pain during my care.

## 2015-06-18 NOTE — Consult Note (Signed)
I discussed her condition with family.  They wish to have her go home with Hospice care if at all possible.  TB still up, climbing some.  Foley placed for unable to void, made worse by constipation.  She wishes to try without it as soon as possible. Will leave it up to Dr. Darvin Neighbours tomorrow.   Would avoid significant diuresis as hypovolemic state promotes shifting of ammonia to CNS.    Discussed with husband about her MELD score and 53% mortality in 3 months and that 47% live longer than that.  Prognosis is relatively poor and family knows this.

## 2015-06-19 LAB — TYPE AND SCREEN
ABO/RH(D): A NEG
Antibody Screen: NEGATIVE
UNIT DIVISION: 0
UNIT DIVISION: 0
Unit division: 0

## 2015-06-19 MED ORDER — FUROSEMIDE 40 MG PO TABS: 80.0000 mg | ORAL_TABLET | Freq: Every day | ORAL | Status: DC

## 2015-06-19 MED ORDER — AMOXICILLIN-POT CLAVULANATE 500-125 MG PO TABS: 1.0000 | ORAL_TABLET | Freq: Two times a day (BID) | ORAL | Status: AC

## 2015-06-19 MED ORDER — OXYCODONE HCL 5 MG PO TABS: 5.0000 mg | ORAL_TABLET | ORAL | Status: AC | PRN

## 2015-06-19 MED ORDER — LACTULOSE 10 GM/15ML PO SOLN: 30.0000 g | Freq: Four times a day (QID) | ORAL | Status: AC

## 2015-06-19 MED ORDER — MIDODRINE HCL 10 MG PO TABS: 10.0000 mg | ORAL_TABLET | Freq: Two times a day (BID) | ORAL | Status: AC

## 2015-06-19 MED ORDER — FUROSEMIDE 80 MG PO TABS: 80.0000 mg | ORAL_TABLET | Freq: Every day | ORAL | Status: AC

## 2015-06-19 MED ORDER — BISACODYL 10 MG RE SUPP: 10.0000 mg | Freq: Every day | RECTAL | Status: AC | PRN

## 2015-06-19 MED ORDER — LORAZEPAM 0.5 MG PO TABS: 0.5000 mg | ORAL_TABLET | Freq: Four times a day (QID) | ORAL | Status: AC | PRN

## 2015-06-19 NOTE — Progress Notes (Signed)
Alert and oriented. Discharge order received. Discharge instruction received. Family verbalized understanding.

## 2015-06-19 NOTE — Discharge Instructions (Signed)
[place] [facility]

## 2015-06-19 NOTE — Progress Notes (Addendum)
Patient and family wants to keep foley- in upon discharge.  Dr. Darvin Neighbours aware.

## 2015-06-19 NOTE — Care Management (Signed)
Patient to discharge home with hospice services at home through [facility].

## 2015-06-19 NOTE — Progress Notes (Signed)
New referral for [facility] services at home following discharge received from [place]. Patient is a 65 year old woman admitted to [facility] on 1/24 for evaluation and treatment of lower leg edema and tremors, she was found to be in acute renal failure. This is her 4 th admission in the past 6 months. She has known history of alcoholic liver cirrhosis, and has been seen by both gastroenterologist and nephrologist this hospitalization. Hemoglobin on admission was 5.7, she is status post 2 units of blood, her liver function tests remain elevated and MELD score is 35, she also has an elevated INR.  Patient and family have been meeting with palliative [place] physician Dr. Megan Salon and have now chosen to return home with hospice services, focusing on her comfort and quality at end of life.  Writer met in the patient room with she and her husband Annie Main, daughter Kathlee Nations and Estrella Myrtle to initiate education related to hospice services, philosophy and team approach to care with good understanding voiced by all. Patient and family have requested the following DME to be delivered today: Hospital bed, front wheeled walker, transport wheel chair and BSC. Family plans to transport patient home via car AFTER equipment is delivered today. Family/patient have also requested that the foley catheter remain in place. This was placed for retention, patient is agreeable to hospice RN removing after discharge. Patient remained interactive and mainly alert through out the visit, she is excited and ready to return to her home. She is notably jaundice, reports no pain, appetite poor. All information faxed to referral intake. Hospital care team all aware of and in agreement with discharge plan. Signed out of facility DNR form in place on patient's hospital chart. Thank you for the opportunity to be involved in the care of this patient. Flo Shanks RN, BSN, [place] and [facility] liaison [phone] c

## 2015-06-19 NOTE — Consult Note (Signed)
I discussed with family and patient that the foley is a portal of possible infection and would be best if it could come out tomorrow.  They understand.  I asked them to call office Friday to give my nurse an up date on her condition.

## 2015-06-19 NOTE — Progress Notes (Signed)
Palliative Medicine Inpatient Consult Follow Up Note   Name: Anne Hickman Date: 06/19/2015 MRN: [id number]  DOB: [birth year]  Referring Physician: Hillary Bow, MD  Palliative Care consult requested for this 65 y.o. female for goals of medical therapy in patient with end stage alcoholic cirrhosis.  Family has opted to go with Hospice in the Home setting.    TODAY'S DISCUSSIONS AND DECISIONS: 1.  Pt is DNR.  Portable form is in the chart.  2.  I have completed the DC med rec list, with Dr. Boykin Reaper input.  She is having her lasix cut back to 80 mg daily instead of bid --as he had instructed.  3.  Hospice Liaison will be seeing pt/ family soon.  I have updated care team.       IMPRESSION: 1. Alcoholic Cirrhosis ----pertinent labs: Total bili 16.3 -14.4 now. Cr 2.49 - 2.25 now INR 3.56 on 1/12 and 2.80 now. NH4 52 on 1/24. Positive for presence of ascites described as moderate on CT scan from 1/20. Sodium 130. No dialysis has been done as far as I can ascertain. Albumin 2.0.  This gives her a MELD score of 53% 3 month mortality.  2. 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 Leg ulcers--superficial but thought to be infected and being treated with Zosyn 4. Alcoholism 5. Hypothyroidism 6. Dyslipidemia 7. Anxiety 8. Acquired coagulopathy due to cirrhosis 9. Increased leg edema and tremors related to liver disease ' 10. Eczema 11. Leukocytosis 12. Hepatic encephalopathy 13. Anemia --severe drop if true lab value (now showing hgb of 5.8 where it had been 8.2 on 1/20/ 17.  14. Probable depression    CODE STATUS: DNR   PAST MEDICAL HISTORY: Past Medical History  Diagnosis Date  . Insomnia   . Generalized anxiety disorder   . Other and unspecified hyperlipidemia   . Unspecified hypothyroidism   . Alcohol abuse, episodic   . Eczema herpeticum     PAST SURGICAL HISTORY:  Past Surgical History   Procedure Laterality Date  . Appendectomy  1978  . Abdominal hysterectomy      Partial   . Esophagogastroduodenoscopy (egd) with propofol N/A 12/13/2014    Procedure: ESOPHAGOGASTRODUODENOSCOPY (EGD) WITH PROPOFOL;  Surgeon: Manya Silvas, MD;  Location: [facility];  Service: Endoscopy;  Laterality: N/A;    Vital Signs: BP 103/51 mmHg  Pulse 81  Temp(Src) 98.1 F (36.7 C) (Oral)  Resp 16  Ht 5\' 4"  (1.626 m)  Wt 59.149 kg (130 lb 6.4 oz)  BMI 22.37 kg/m2  SpO2 98% Filed Weights   06/12/15 1342  Weight: 59.149 kg (130 lb 6.4 oz)    Estimated body mass index is 22.37 kg/(m^2) as calculated from the following:   Height as of this encounter: 5\' 4"  (1.626 m).   Weight as of this encounter: 59.149 kg (130 lb 6.4 oz).  PHYSICAL EXAM: Up in wheelchair sitting beside a window in the hall with family Alert at this time Talking to me and grossly oriented.  Smiling Jaundice and scleral icterus is quite obvious in the good lighting No JVD Hrt rrr no m Lungs cta ant Abd protuberant Ext no cyanosis or mottling   LABS: CBC:    Component Value Date/Time   WBC 28.3* 06/18/2015 0431   HGB 7.7* 06/18/2015 0431   HCT 21.6* 06/18/2015 0431   PLT 152 06/18/2015 0431   MCV 94.4 06/18/2015 0431   NEUTROABS 24.4* 06/18/2015 0431   LYMPHSABS 1.8 06/18/2015 0431  MONOABS 1.6* 06/18/2015 0431   EOSABS 0.4 06/18/2015 0431   BASOSABS 0.2* 06/18/2015 0431   Comprehensive Metabolic Panel:    Component Value Date/Time   NA 138 06/18/2015 0431   K 4.2 06/18/2015 0431   CL 106 06/18/2015 0431   CO2 22 06/18/2015 0431   BUN 85* 06/18/2015 0431   CREATININE 2.31* 06/18/2015 0431   GLUCOSE 103* 06/18/2015 0431   CALCIUM 8.5* 06/18/2015 0431   AST 100* 06/18/2015 0431   ALT 91* 06/18/2015 0431   ALKPHOS 123 06/18/2015 0431   BILITOT 18.6* 06/18/2015 0431   PROT 6.6 06/18/2015 0431   ALBUMIN 1.7* 06/18/2015 0431    More than 50% of the visit was spent in counseling/coordination  of care: YES  Time Spent:  35 min

## 2015-06-19 NOTE — Clinical Social Work Note (Signed)
Palliative Care requested if CSW could offer hospice choices to patient's husband in order to prepare for home with hospice today. CSW spoke with patient's husband and he stated that he prefers the hospice agency that is affiliated with the hospice home here in [place]. CSW has made referral to [place] with [facility]. Shela Leff MSW,LCSW [phone]

## 2015-06-21 NOTE — Discharge Summary (Signed)
[facility] NAME: Anne Hickman    MR#:  [id number]  DATE OF BIRTH:  1950/07/25  DATE OF ADMISSION:  06/12/2015 ADMITTING PHYSICIAN: Fritzi Mandes, MD  DATE OF DISCHARGE: 06/19/2015  4:54 PM  PRIMARY CARE PHYSICIAN: Maryland Pink, MD    ADMISSION DIAGNOSIS:  Acute renal failure  DISCHARGE DIAGNOSIS:  Active Problems:   Leg edema   Alcoholic cirrhosis of liver with ascites ([place])   SECONDARY DIAGNOSIS:   Past Medical History  Diagnosis Date  . Insomnia   . Generalized anxiety disorder   . Other and unspecified hyperlipidemia   . Unspecified hypothyroidism   . Alcohol abuse, episodic   . Eczema herpeticum      ADMITTING HISTORY  Anne Hickman is a 65 y.o. 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 Percell [place] office with increasing leg edema and tremors Patient denies any fever. She has developed significant leg edema since her discharge 05/31/2015 Patient also has developed superficial ulcers over her lower extremity Denies any weight loss. She appears severely jaundiced  HOSPITAL COURSE:   1. severe bilateral lower extremity edema with superficial ulcers and elevated white count - improving - Blood cultures - NGTD - On IV Zosyn. Changed to PO augmentin. - Negative DVT studies.  2. Severe alcoholic liver cirrhosis with ascitis and edema - worseing Bilirubin -Followed by Dr. Vira Agar - prognosis extremely poor On Aldactone. Increased lasix to 80mg  PO BID. Lasix 80 daily at discharge.  3. Acquired coagulopathy secondary to cirrhosis  Patient has several ecchymosis or her skin  4. Hypothyroidism continue Synthroid  5. DVT prophylaxis none since patient has acquired coagulopathy  INR is 2.8  6. 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 Monitor. Appreciate Nephrology help Will likely worsen  7.  Anemia of chronic disease- worsening. Transfused 2 units PRBC. Could also have GI losses  8. Hepatic encephaopathy Started lactulose  Patient slowly worsening during her hospital stay with her liver failure. Patient was seen by palliative care. After further counseling family decided to take the patient home with hospice services set up.  Patient might be able to transition to hospice home if family wishes at a later date.  Foley catheter left in place for patient convenience.  CONSULTS OBTAINED:  Treatment Team:  Lavonia Dana, MD  DRUG ALLERGIES:   Allergies  Allergen Reactions  . Bee Venom Anaphylaxis  . Advil [Ibuprofen] Nausea And Vomiting  . Aleve [Naproxen Sodium] Nausea And Vomiting  . Betadine [Povidone Iodine] Other (See Comments)    Reaction:  Burning   . Codeine Other (See Comments)    Reaction:  GI upset     DISCHARGE MEDICATIONS:   Discharge Medication List as of 06/19/2015  1:04 PM    START taking these medications   Details  amoxicillin-clavulanate (AUGMENTIN) 500-125 MG tablet Take 1 tablet (500 mg total) by mouth 2 (two) times daily., Starting 06/19/2015, Until Discontinued, Print    bisacodyl (DULCOLAX) 10 MG suppository Place 1 suppository (10 mg total) rectally daily as needed for moderate constipation., Starting 06/19/2015, Until Discontinued, Print    furosemide (LASIX) 80 MG tablet Take 1 tablet (80 mg total) by mouth daily., Starting 06/20/2015, Until Discontinued, Normal    lactulose (CHRONULAC) 10 GM/15ML solution Take 45 mLs (30 g total) by mouth every 6 (six) hours., Starting 06/19/2015, Until Discontinued, Print    LORazepam (ATIVAN) 0.5 MG  tablet Take 1 tablet (0.5 mg total) by mouth every 6 (six) hours as needed for anxiety., Starting 06/19/2015, Until Discontinued, Print    midodrine (PROAMATINE) 10 MG tablet Take 1 tablet (10 mg total) by mouth 2 (two) times daily with a meal., Starting 06/19/2015, Until Discontinued, Print    oxyCODONE (OXY  IR/ROXICODONE) 5 MG immediate release tablet Take 1 tablet (5 mg total) by mouth every 4 (four) hours as needed (mild/mod pain)., Starting 06/19/2015, Until Discontinued, Print      CONTINUE these medications which have NOT CHANGED   Details  levothyroxine (SYNTHROID, LEVOTHROID) 75 MCG tablet Take 75 mcg by mouth daily before breakfast., Until Discontinued, Historical Med    pantoprazole (PROTONIX) 40 MG tablet Take 40 mg by mouth daily., Until Discontinued, Historical Med    spironolactone (ALDACTONE) 50 MG tablet Take 50 mg by mouth daily., Until Discontinued, Historical Med      STOP taking these medications     Multiple Vitamin (MULTIVITAMIN WITH MINERALS) TABS tablet      ondansetron (ZOFRAN) 4 MG tablet      predniSONE (DELTASONE) 20 MG tablet      traMADol (ULTRAM) 50 MG tablet        Today   VITAL SIGNS:  Blood pressure 125/64, pulse 79, temperature 98.9 F (37.2 C), temperature source Oral, resp. rate 19, height 5\' 4"  (1.626 m), weight 59.149 kg (130 lb 6.4 oz), SpO2 98 %.  I/O:  No intake or output data in the 24 hours ending 06/21/15 1542  PHYSICAL EXAMINATION:  Physical Exam  GENERAL:  65 y.o.-year-old patient lying in the bed with no acute distress. Icteric. LUNGS: Normal breath sounds bilaterally, no wheezing, rales,rhonchi or crepitation. No use of accessory muscles of respiration.  CARDIOVASCULAR: S1, S2 normal. No murmurs, rubs, or gallops.  ABDOMEN: Soft, non-tender, non-distended. Bowel sounds present. No organomegaly or mass.  NEUROLOGIC: Moves all 4 extremities. PSYCHIATRIC: The patient is alert and awake. SKIN: Bruising.  DATA REVIEW:   CBC  Recent Labs Lab 06/18/15 0431  WBC 28.3*  HGB 7.7*  HCT 21.6*  PLT 152    Chemistries   Recent Labs Lab 06/16/15 0515  06/18/15 0431  NA 134*  < > 138  K 4.4  < > 4.2  CL 108  < > 106  CO2 17*  < > 22  GLUCOSE 88  < > 103*  BUN 94*  < > 85*  CREATININE 2.26*  < > 2.31*  CALCIUM 8.1*  < >  8.5*  MG 1.6*  --   --   AST 109*  < > 100*  ALT 94*  < > 91*  ALKPHOS 134*  < > 123  BILITOT 15.7*  < > 18.6*  < > = values in this interval not displayed.  Cardiac Enzymes No results for input(s): TROPONINI in the last 168 hours.  Microbiology Results  Results for orders placed or performed during the hospital encounter of 06/12/15  Culture, blood (Routine X 2) w Reflex to ID Panel     Status: None   Collection Time: 06/12/15  6:56 PM  Result Value Ref Range Status   Specimen Description BLOOD RIGHT HAND  Final   Special Requests BOTTLES DRAWN AEROBIC AND ANAEROBIC [place]  Final   Culture NO GROWTH 5 DAYS  Final   Report Status 06/17/2015 FINAL  Final  Culture, blood (Routine X 2) w Reflex to ID Panel     Status: None   Collection Time: 06/12/15  6:58 PM  Result Value Ref Range Status   Specimen Description BLOOD LEFT HAND  Final   Special Requests BOTTLES DRAWN AEROBIC AND ANAEROBIC 1CC  Final   Culture NO GROWTH 5 DAYS  Final   Report Status 06/17/2015 FINAL  Final    RADIOLOGY:  No results found.  Follow up with PCP in 1 week.  Management plans discussed with the patient, family and they are in agreement.  CODE STATUS:  Code Status History    Date Active Date Inactive Code Status Order ID Comments User Context   06/17/2015 11:05 AM 06/19/2015  7:54 PM DNR [id number]  Hillary Bow, MD Inpatient   06/12/2015  3:15 PM 06/17/2015 11:05 AM Full Code [id number]  Fritzi Mandes, MD Inpatient   05/30/2015  9:22 PM 06/02/2015  2:41 PM Full Code [id number]  Henreitta Leber, MD Inpatient    Questions for Most Recent Historical Code Status (Order [id number])    Question Answer Comment   In the event of cardiac or respiratory ARREST Do not call a "code blue"    In the event of cardiac or respiratory ARREST Do not perform Intubation, CPR, defibrillation or ACLS    In the event of cardiac or respiratory ARREST Use medication by any route, position, wound care, and other measures to relive pain  and suffering. May use oxygen, suction and manual treatment of airway obstruction as needed for comfort.       TOTAL TIME TAKING CARE OF THIS PATIENT ON DAY OF DISCHARGE: more than 30 minutes.    Hillary Bow R M.D on 06/21/2015 at 3:42 PM  Between 7am to 6pm - Pager - [phone]  After 6pm go to [email or web address] - password [email or web address] [facility]  Office  [phone]  CC: Primary care physician; Maryland Pink, MD   Note: This dictation was prepared with Dragon dictation along with smaller phrase technology. Any transcriptional errors that result from this process are unintentional.

## 2015-07-18 DEATH — deceased

## 2016-06-12 IMAGING — US US ABDOMEN LIMITED
1 series · 14 of 25 positions shown · non-contrast
Comparison: Ultrasound February 20, 2015.

CLINICAL DATA: Jaundice.

EXAM:
US ABDOMEN LIMITED - RIGHT UPPER QUADRANT

[Series 1: us abdomen limited · 0.25mm/px · 92 acquisitions, 14 frames shown]
[im 1/92]
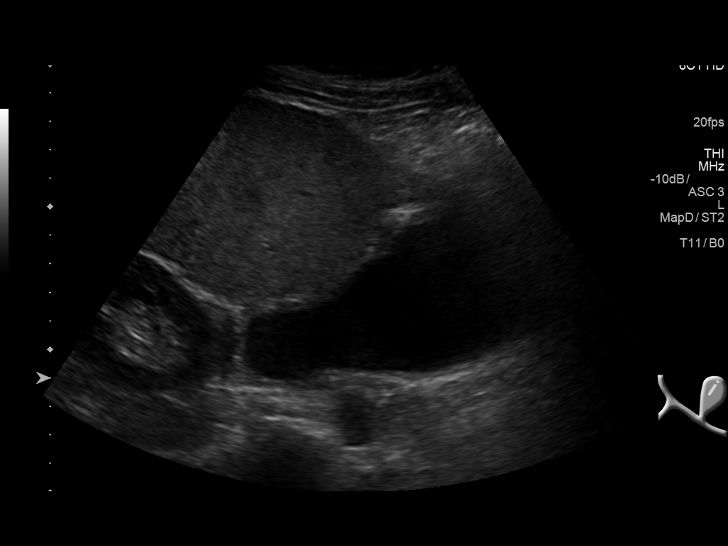
[im 8/92]
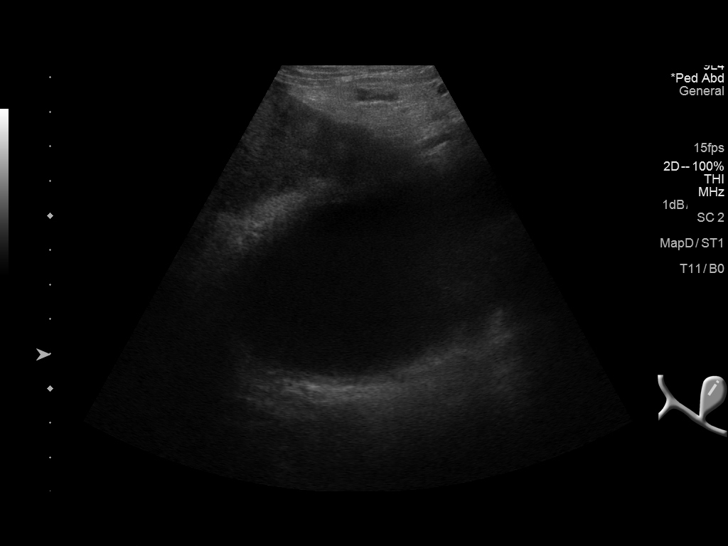
[im 16/92]
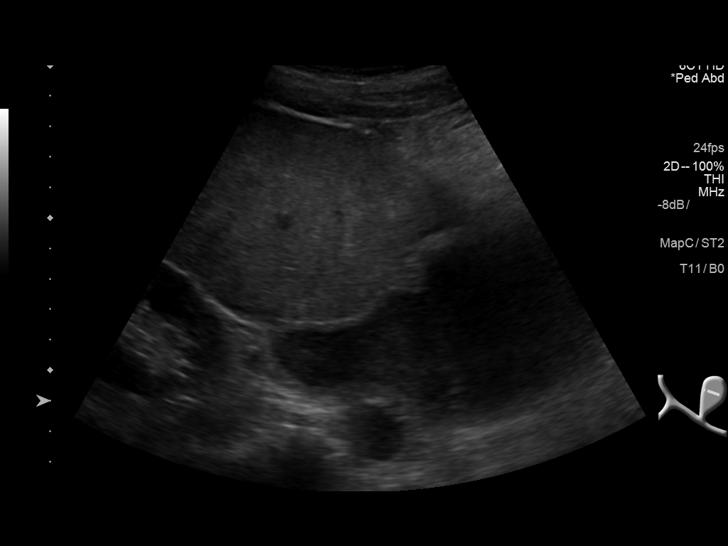
[im 23/92]
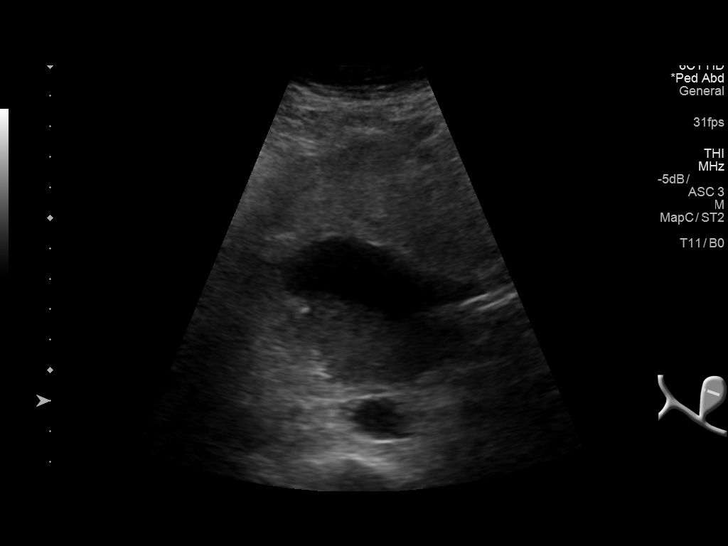
[im 31/92]
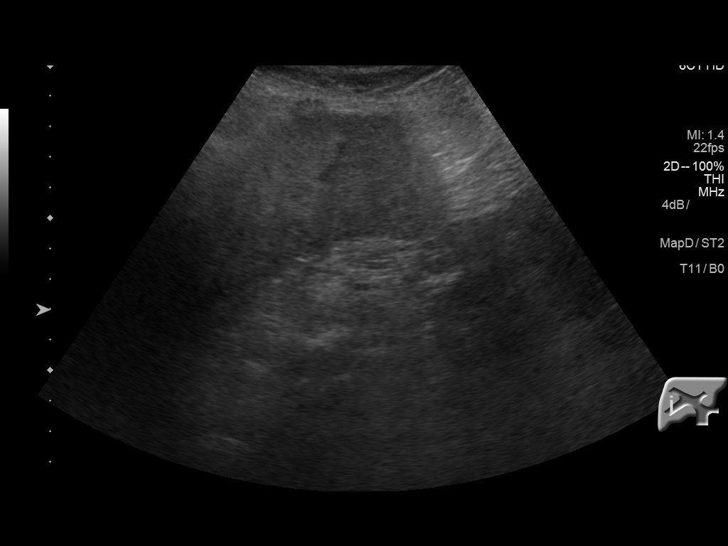
[im 35/92]
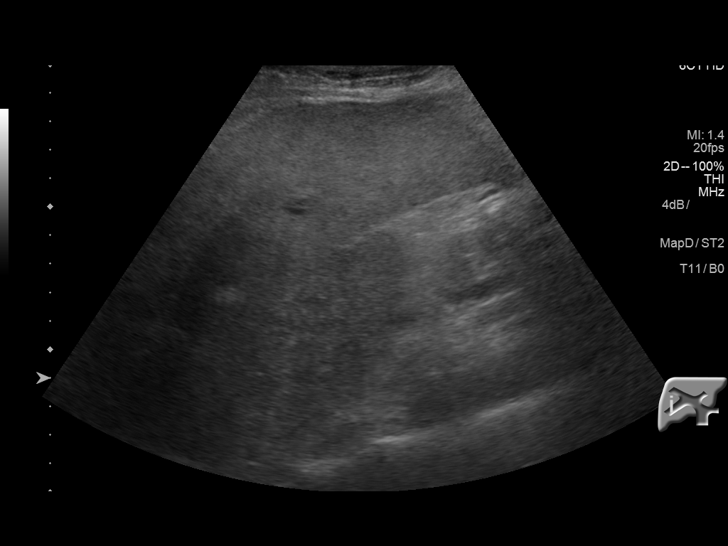
[im 42/92]
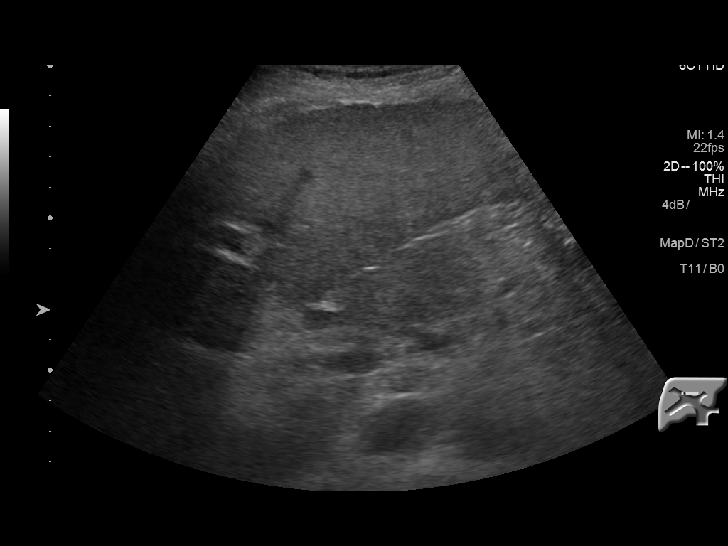
[im 50/92]
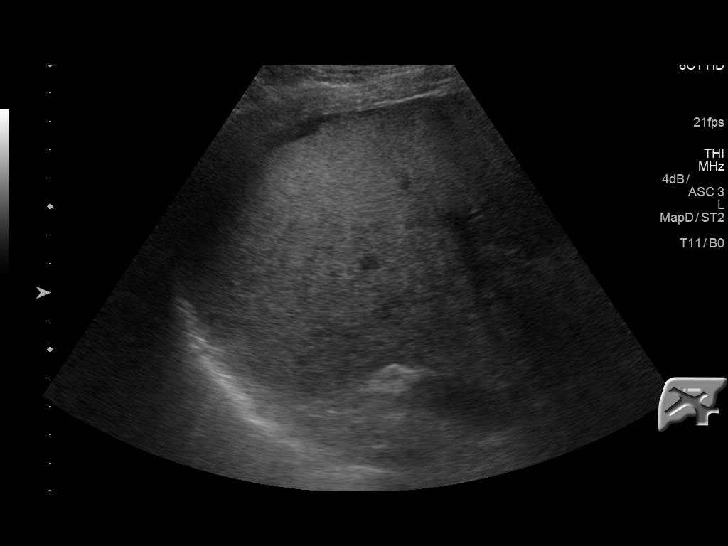
[im 57/92]
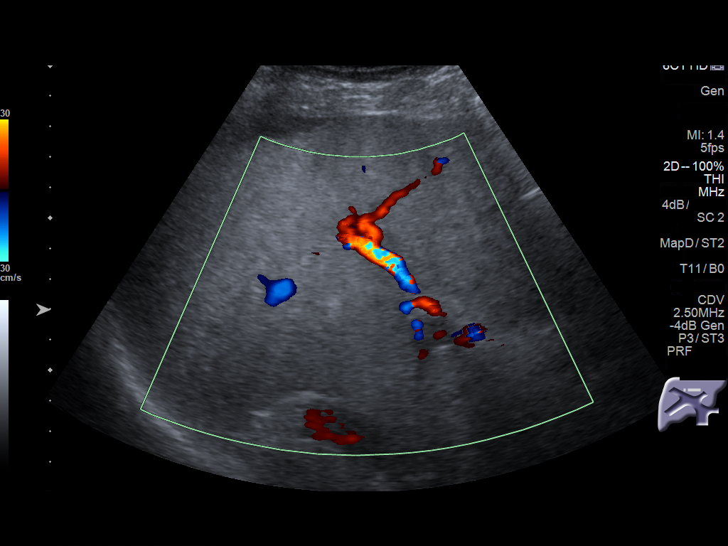
[im 61/92]
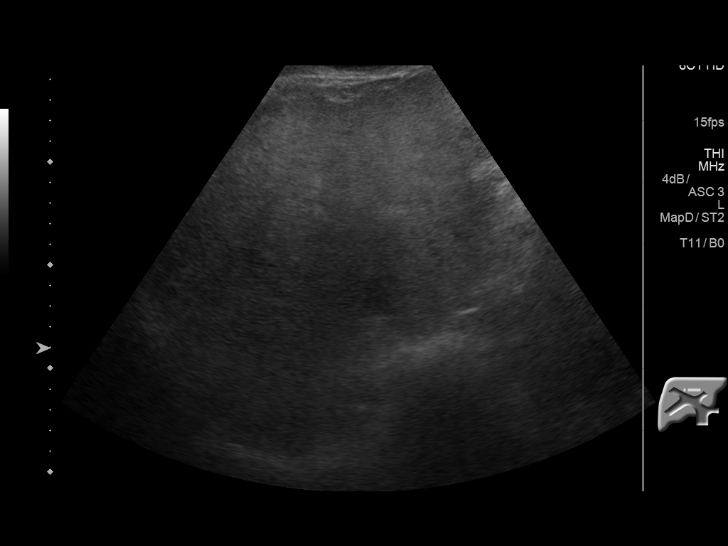
[im 69/92]
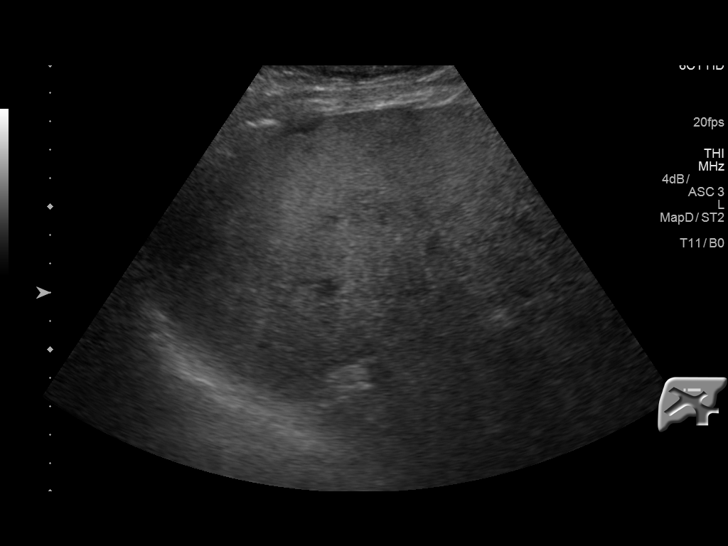
[im 76/92]
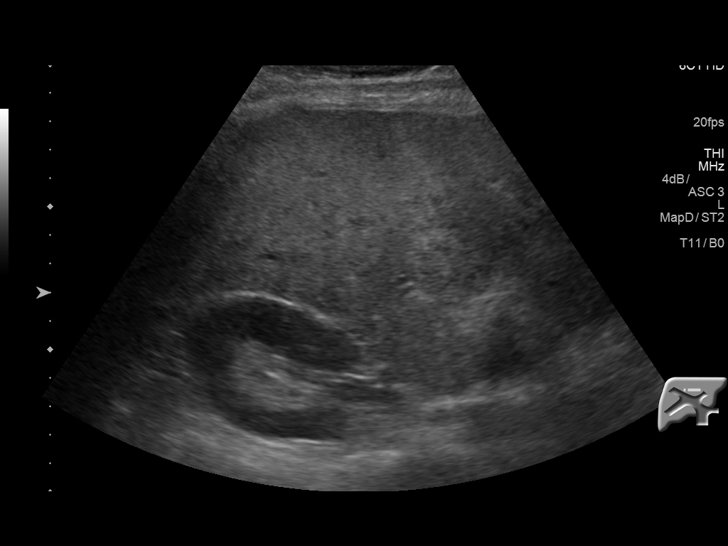
[im 84/92]
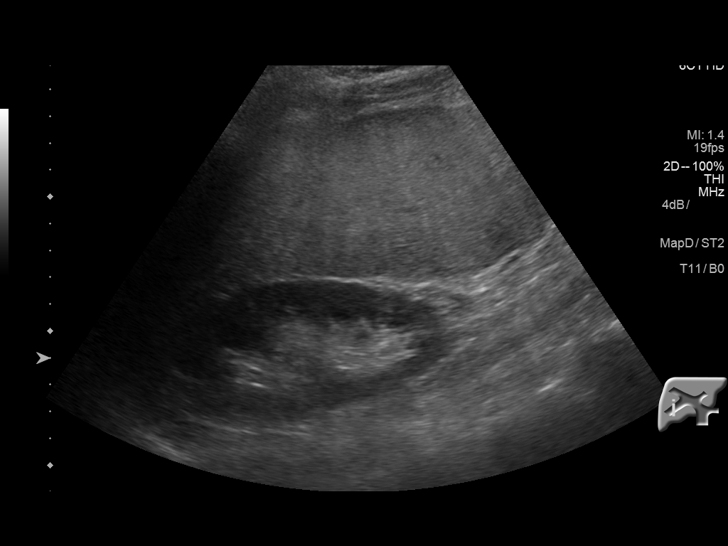
[im 92/92]
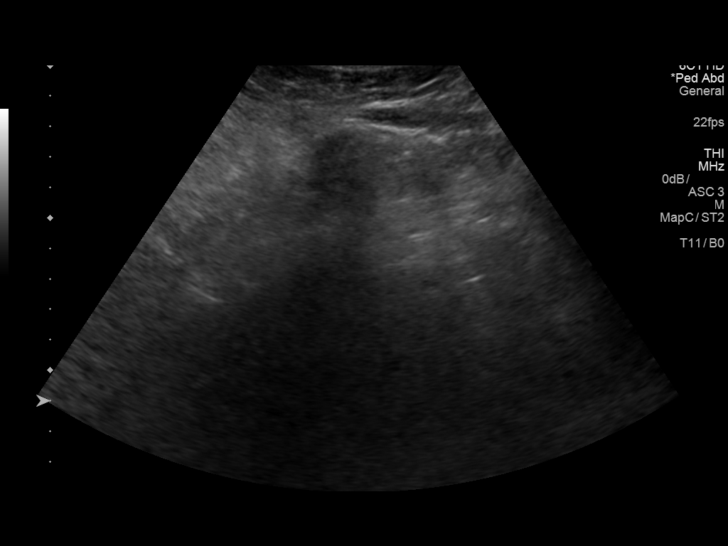

[14 of 25 positions shown; findings below may reference images not displayed]

FINDINGS: Gallbladder:

No gallstones visualized. No sonographic Murphy sign noted by
sonographer. Mild gallbladder wall thickening measuring 4.6 mm is
noted. No definite pericholecystic fluid is noted.

Common bile duct:

Diameter: 4.4 mm which is within normal limits.

Liver:

No focal lesion identified. Coarse echogenicity is noted with
nodular hepatic margins suggesting early hepatic cirrhosis.
Bidirectional flow is noted in the portal vein. Small amount of
ascites is noted around the liver.
IMPRESSION: Findings concerning for hepatic cirrhosis with minimal ascites.

No biliary dilatation is noted.

Mild gallbladder wall thickening is noted most likely due to
adjacent hepatocellular disease. No cholelithiasis is noted.

## 2017-03-18 IMAGING — CR DG CHEST 1V PORT
1 series · 1 of 1 positions shown · non-contrast
Comparison: Chest x-ray 07/16/2011.

CLINICAL DATA: 64-year-old female with history of weakness for the
past few days. Jaundice.

EXAM:
PORTABLE CHEST 1 VIEW

[ap]
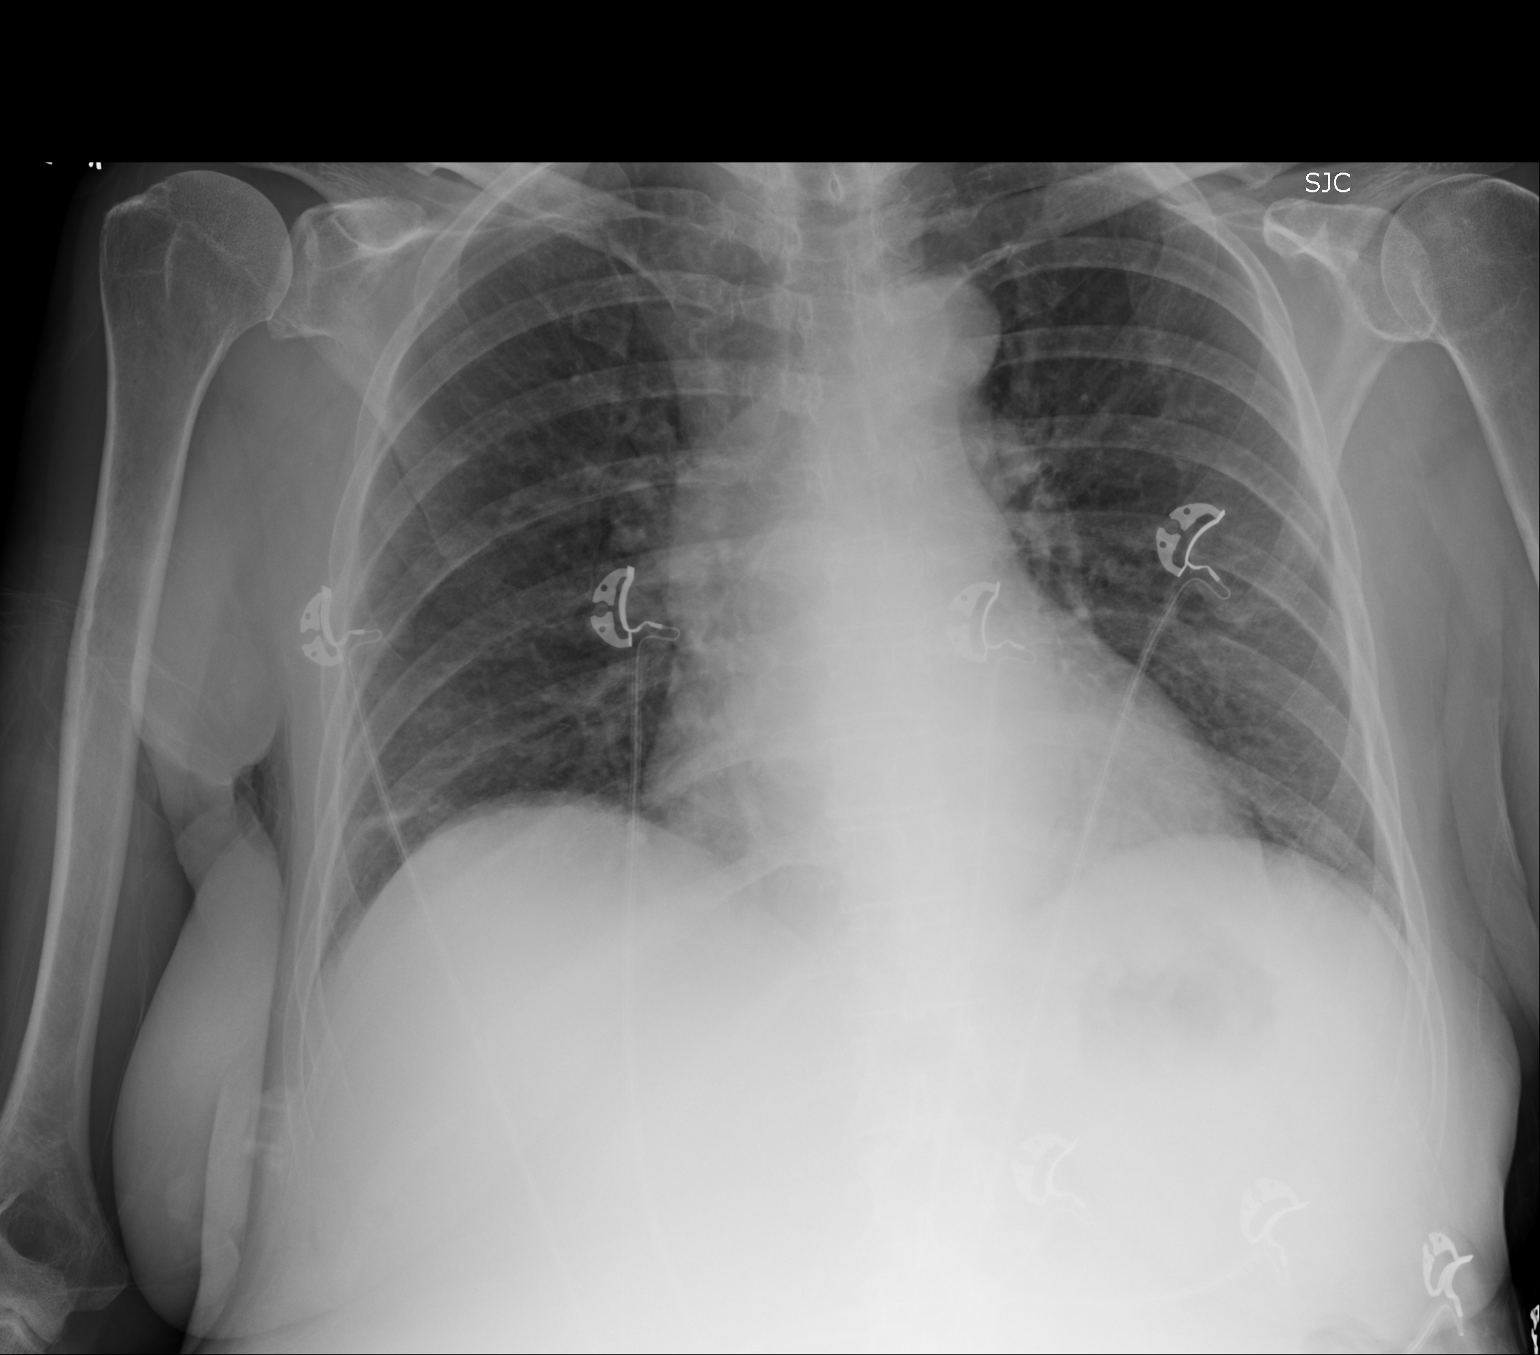

[1 of 1 positions shown; findings below may reference images not displayed]

FINDINGS: Mild diffuse peribronchial cuffing. Lung volumes are low. Linear
opacities in the right lung base, compatible with mild scarring,
similar to the prior examination. No consolidative airspace disease.
No pleural effusions. No pneumothorax. No pulmonary nodule or mass
noted. Pulmonary vasculature and the cardiomediastinal silhouette
are within normal limits.
IMPRESSION: 1. Mild diffuse peribronchial cuffing suggestive of mild acute
bronchitis.

## 2017-03-27 IMAGING — CT CT CHEST W/O CM
1 of 2 series · 14 of 32 positions shown, 18 images · non-contrast
Comparison: Ultrasound of the abdomen dated 05/30/2015

CLINICAL DATA: Diffuse abdominal pain and distention. History of
cirrhosis.

EXAM:
CT CHEST, ABDOMEN AND PELVIS WITHOUT CONTRAST
TECHNIQUE: Multidetector CT imaging of the chest, abdomen and pelvis was
performed following the standard protocol without IV contrast.

[Series 4: cap without 2 · axial · non-contrast · 0.82mm/px · z∈[-692,-116]mm · 14 of 129 slices shown, 18 images]
[im 7/129  soft-tissue]
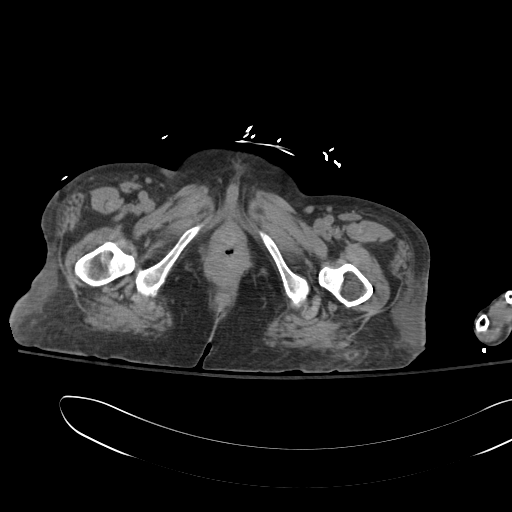
[im 7/129  bone]
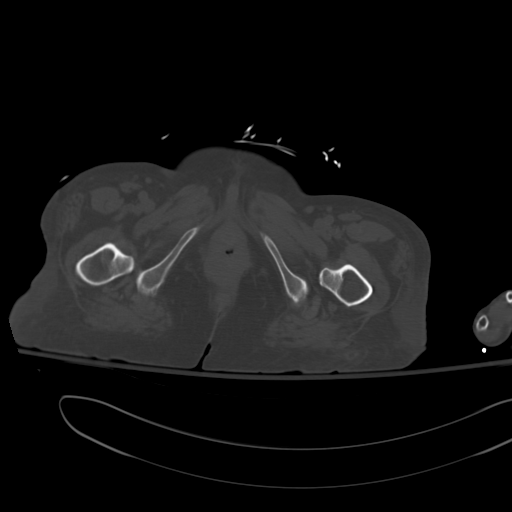
[im 19/129  soft-tissue]
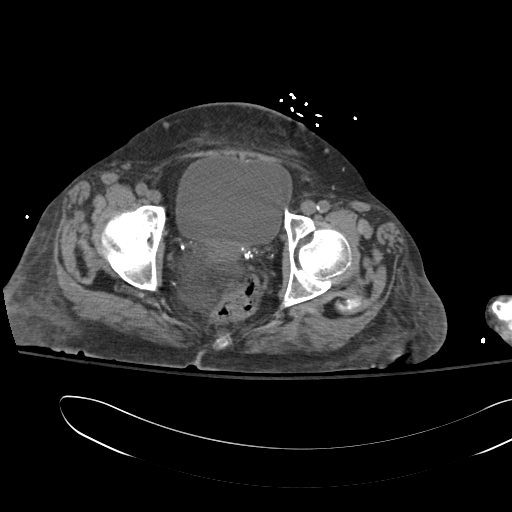
[im 31/129  soft-tissue]
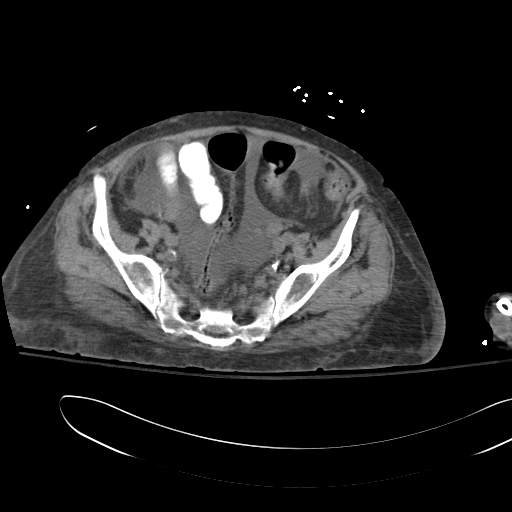
[im 37/129  soft-tissue]
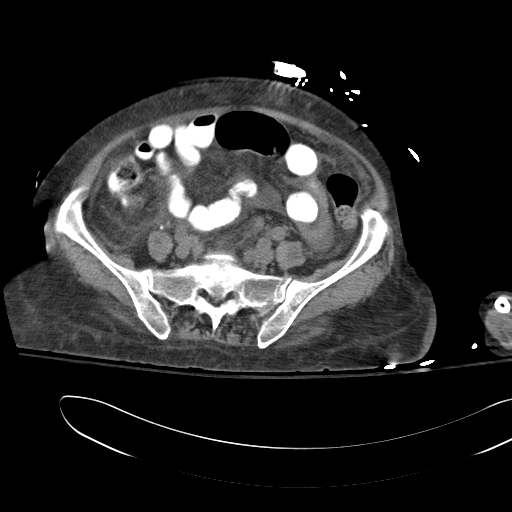
[im 49/129  soft-tissue]
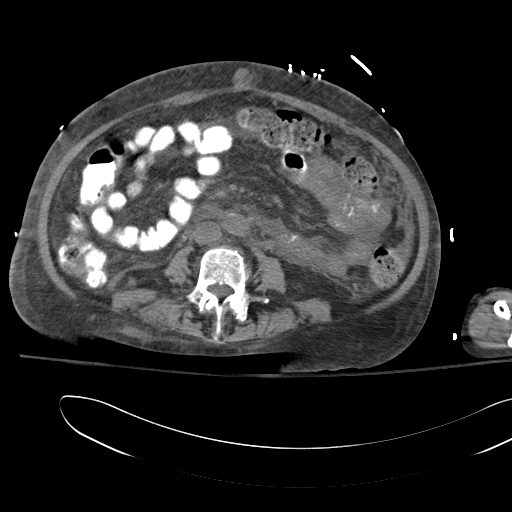
[im 61/129  soft-tissue]
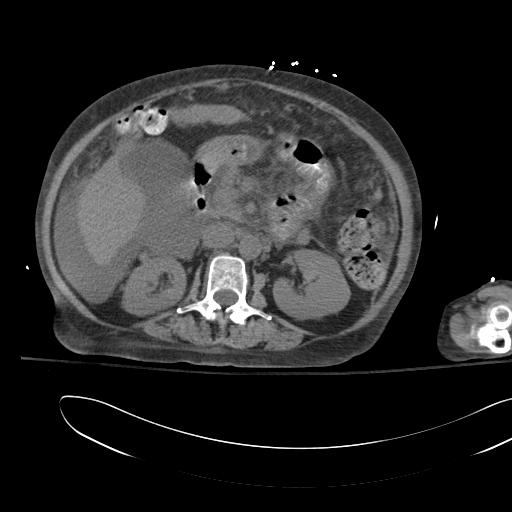
[im 68/129  soft-tissue]
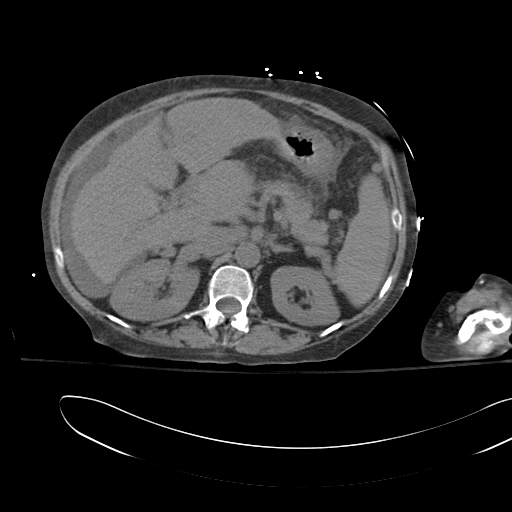
[im 80/129  soft-tissue]
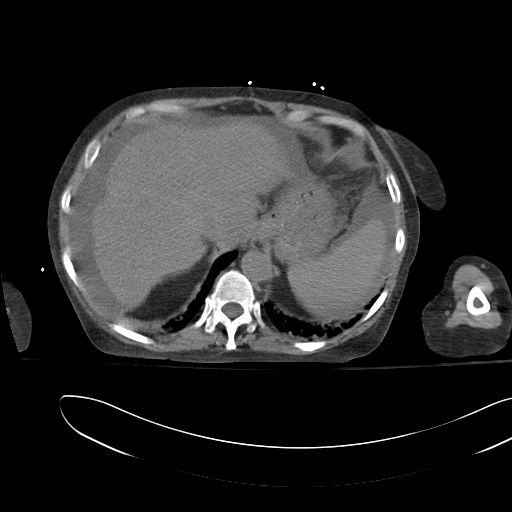
[im 92/129  soft-tissue]
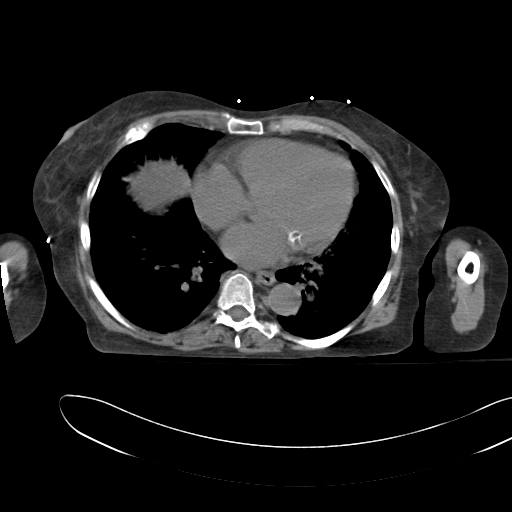
[im 92/129  bone]
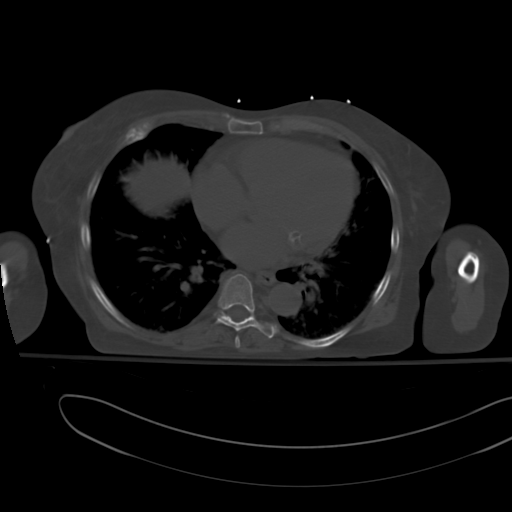
[im 98/129  soft-tissue]
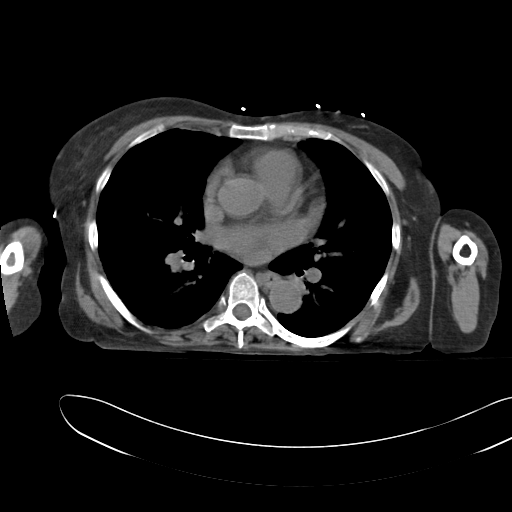
[im 104/129  lung]
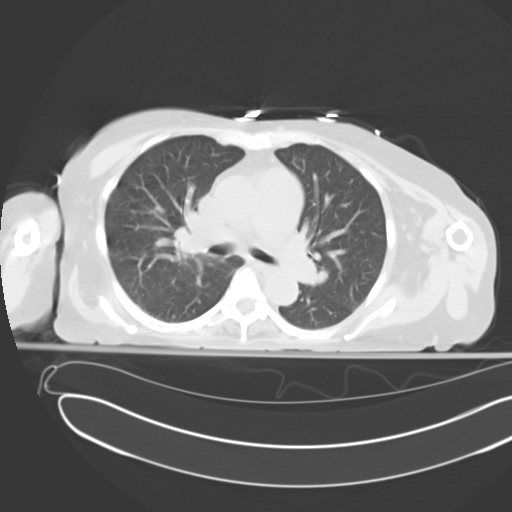
[im 110/129  soft-tissue]
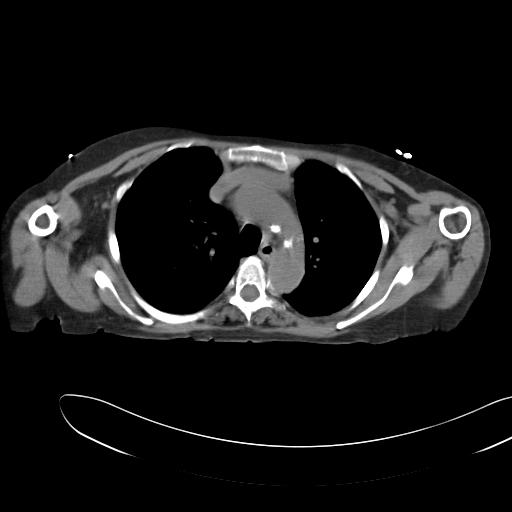
[im 110/129  lung]
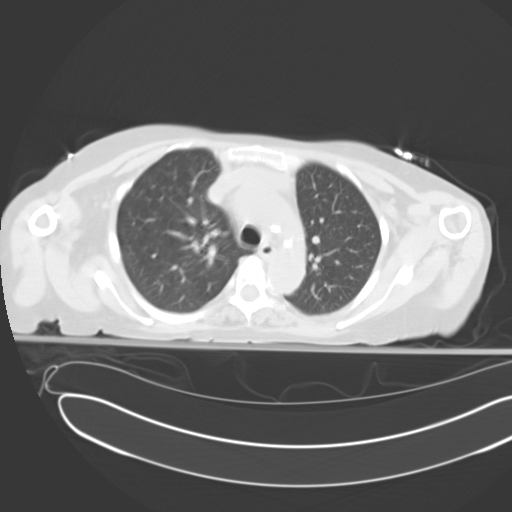
[im 116/129  lung]
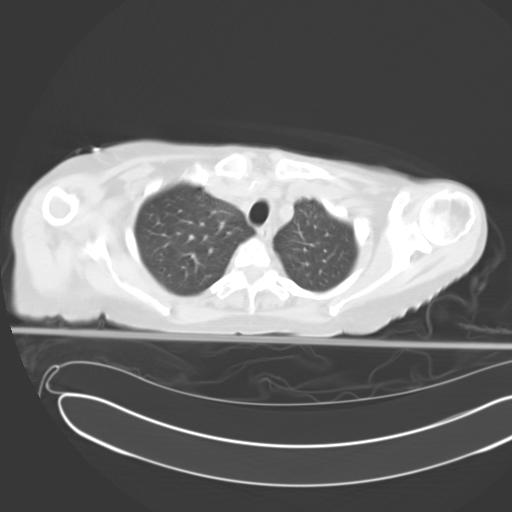
[im 122/129  soft-tissue]
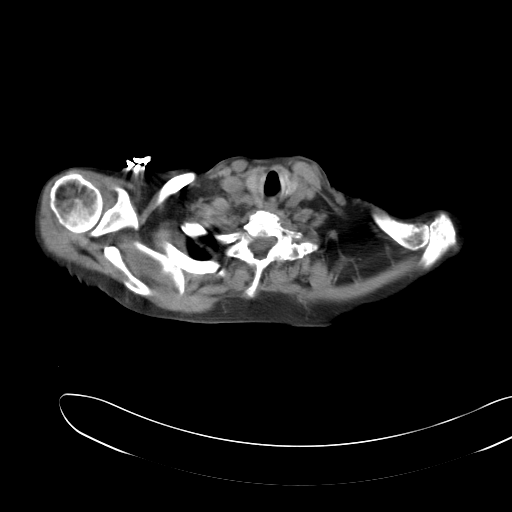
[im 122/129  lung]
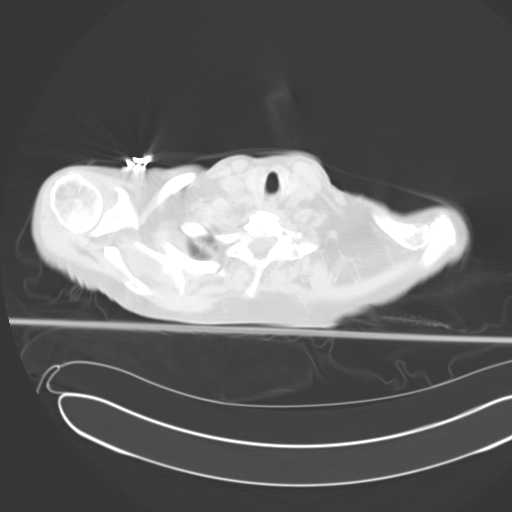

[14 of 32 positions shown; findings below may reference images not displayed]

FINDINGS: CT CHEST FINDINGS

Mediastinum/Lymph Nodes: There is a 3 cm calcifications containing
right inferior pole thyroid mass. No masses or pathologically
enlarged lymph nodes identified on this un-enhanced exam. The heart
is normal in size. There is no pericardial effusion. Annular
calcifications of the mitral valve are noted.

Lungs/Pleura: No pulmonary mass, infiltrate, or effusion.
Hypoventilatory changes of bilateral lower lobes are noted.

Musculoskeletal: No chest wall mass or suspicious bone lesions
identified.

CT ABDOMEN PELVIS FINDINGS

Hepatobiliary: The liver is hyperdense in appearance and with
nodular contours consistent with cirrhosis. There is moderate amount
of abdominal ascites. The gallbladder appears to contain layering
few mm calculi and sludge.

Pancreas: No mass or inflammatory process identified on this
un-enhanced exam.

Spleen: Within normal limits in size.

Adrenals/Urinary Tract: No evidence of urolithiasis. Two 1-2 mm
calculi are seen within the lower pole of the left kidney. No
definite mass visualized on this un-enhanced exam.

Stomach/Bowel: No evidence of obstruction. There is diffuse wall
thickening of the decompressed stomach.

Vascular/Lymphatic: No pathologically enlarged lymph nodes. No
evidence of abdominal aortic aneurysm.

Reproductive: No mass or other significant abnormality. Status post
hysterectomy.

Other: None.

Musculoskeletal: No suspicious bone lesions identified.
Osteoarthritic changes of the lumbosacral spine as seen with 2 mm
anterolisthesis of L3 on L4, likely related to advance posterior
facet arthropathy.
IMPRESSION: Cirrhotic appearance of the liver with moderate amount of ascites.

Layering cholelithiasis. No secondary signs of cholecystitis on this
nonenhanced exam.

Tiny nonobstructing left renal calculi.

3 cm partially calcified right inferior pole thyroid mass.

Osteoarthritic changes of the lumbosacral spine with mild
anterolisthesis of L3 on L4.
